# Patient Record
Sex: Female | Born: 1960 | Race: White | Hispanic: No | Marital: Married | Smoking: Never smoker
Health system: Southern US, Community
[De-identification: ages and names within clinical notes are randomized; demographics above are authoritative.]

## PROBLEM LIST (undated history)

## (undated) DIAGNOSIS — T8859XA Other complications of anesthesia, initial encounter: Secondary | ICD-10-CM

## (undated) DIAGNOSIS — T884XXA Failed or difficult intubation, initial encounter: Secondary | ICD-10-CM

## (undated) HISTORY — PX: TUBAL LIGATION: SHX77

## (undated) HISTORY — PX: NASAL SINUS SURGERY: SHX719

## (undated) HISTORY — PX: TONSILLECTOMY: SUR1361

---

## 2004-11-03 ENCOUNTER — Ambulatory Visit: Payer: Self-pay | Admitting: Unknown Physician Specialty

## 2004-11-09 ENCOUNTER — Ambulatory Visit: Payer: Self-pay | Admitting: Unknown Physician Specialty

## 2008-06-01 ENCOUNTER — Ambulatory Visit: Payer: Self-pay | Admitting: Internal Medicine

## 2009-03-19 ENCOUNTER — Ambulatory Visit: Payer: Self-pay | Admitting: Internal Medicine

## 2013-01-08 ENCOUNTER — Ambulatory Visit: Payer: Self-pay | Admitting: Otolaryngology

## 2013-03-25 DIAGNOSIS — T884XXA Failed or difficult intubation, initial encounter: Secondary | ICD-10-CM | POA: Insufficient documentation

## 2013-04-01 LAB — HM PAP SMEAR: HM PAP: NORMAL

## 2014-07-10 LAB — BASIC METABOLIC PANEL
BUN: 7 mg/dL (ref 4–21)
Creatinine: 0.8 mg/dL (ref 0.5–1.1)

## 2014-07-10 LAB — TSH: TSH: 1.3 u[IU]/mL (ref 0.41–5.90)

## 2014-07-10 LAB — LIPID PANEL
Cholesterol: 259 mg/dL — AB (ref 0–200)
HDL: 71 mg/dL — AB (ref 35–70)
LDL Cholesterol: 172 mg/dL
TRIGLYCERIDES: 81 mg/dL (ref 40–160)

## 2014-07-10 LAB — CBC AND DIFFERENTIAL: HEMOGLOBIN: 13.2 g/dL (ref 12.0–16.0)

## 2014-09-19 LAB — HM MAMMOGRAPHY: HM MAMMO: NORMAL

## 2014-10-02 HISTORY — PX: NASAL SINUS SURGERY: SHX719

## 2015-01-04 IMAGING — CT CT MAXILLOFACIAL WO/W CM
2 series · 15 of 30 positions shown, 19 images · non-contrast
Comparison: No comparison

REASON FOR EXAM: rt maxillary wall abscess infection
COMMENTS:

PROCEDURE:     KCT - KCT MAXILLOFACIAL AREA W/WO  - January 08, 2013 [DATE]
RESULT:     History: Maxillary wall abscess
TECHNIQUE: Multiple axial images obtained of the maxillofacial bones with
coronal reformatted images provided following 75 mL of [id number]

[Series 5: axial soft tissue · axial · 0.36mm/px · z∈[-228,-204]mm · 2 of 88 slices shown]
[im 7/88  brain]
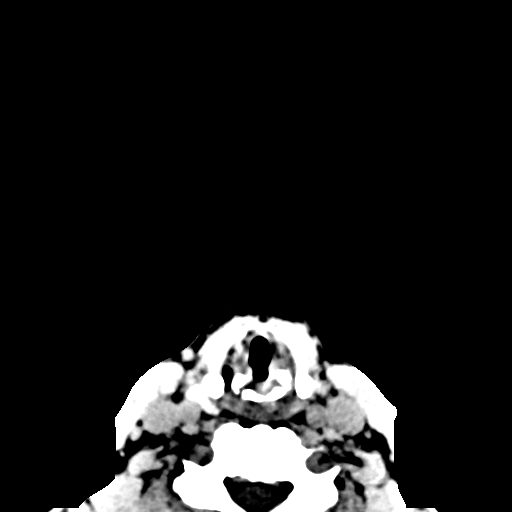
[im 19/88  brain]
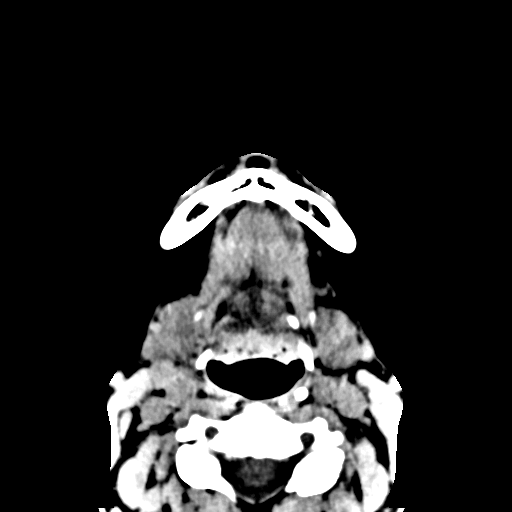

[Series 7: axial soft tissue with · axial · 0.41mm/px · z∈[-228,-68]mm · 13 of 94 slices shown, 17 images]
[im 7/94  brain]
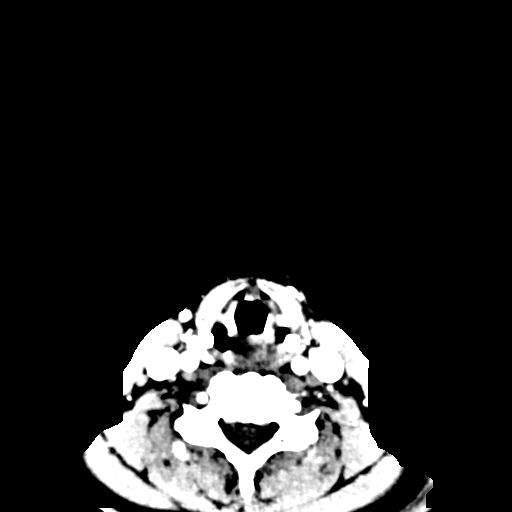
[im 7/94  bone]
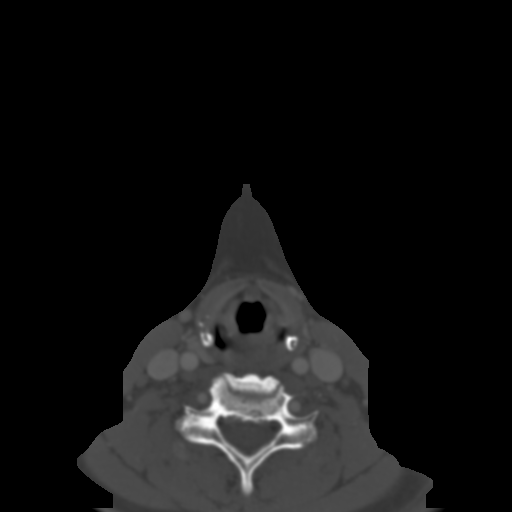
[im 14/94  bone]
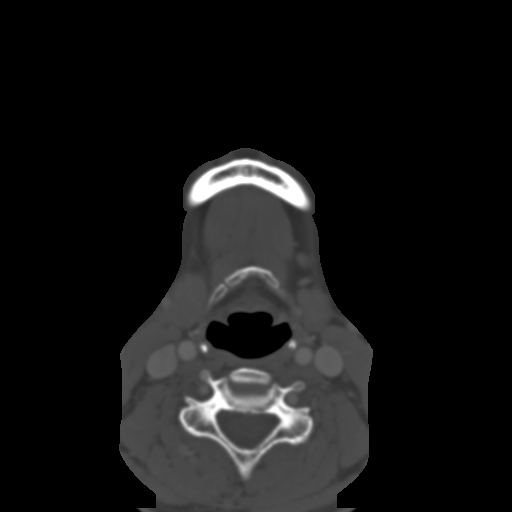
[im 20/94  bone]
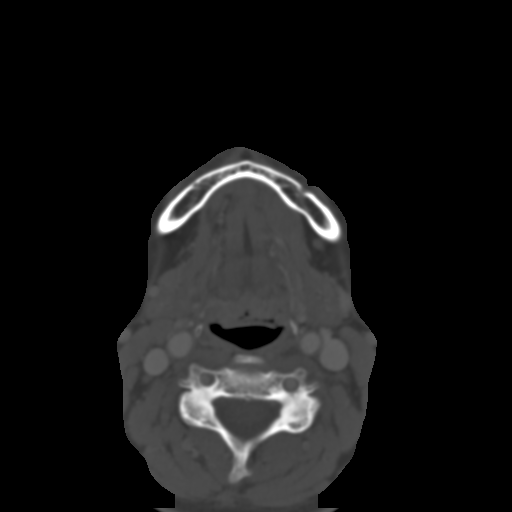
[im 27/94  bone]
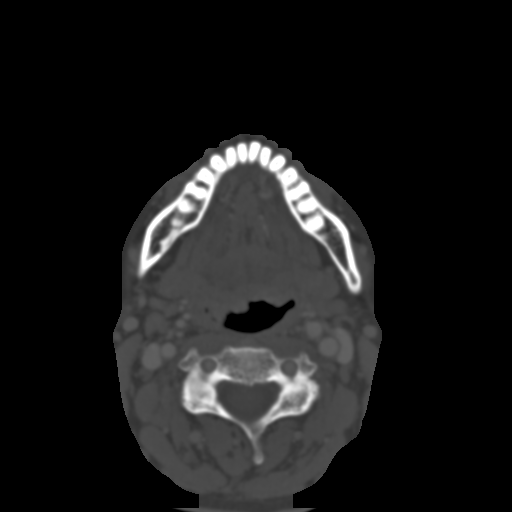
[im 34/94  brain]
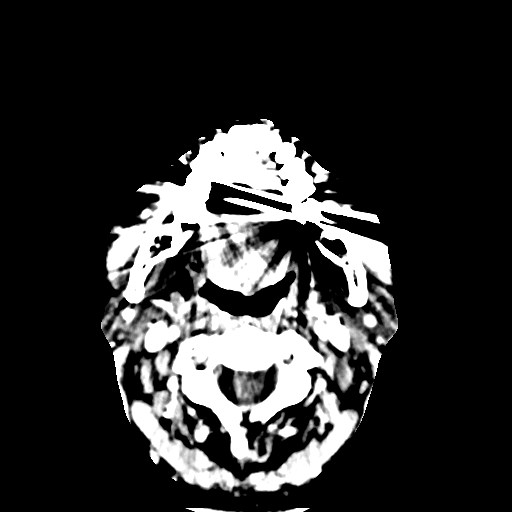
[im 34/94  bone]
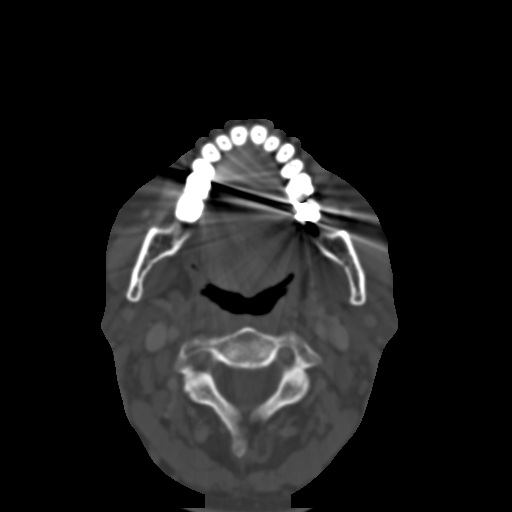
[im 40/94  bone]
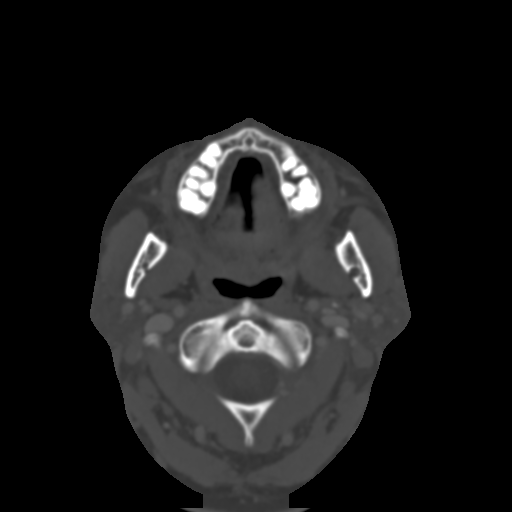
[im 47/94  bone]
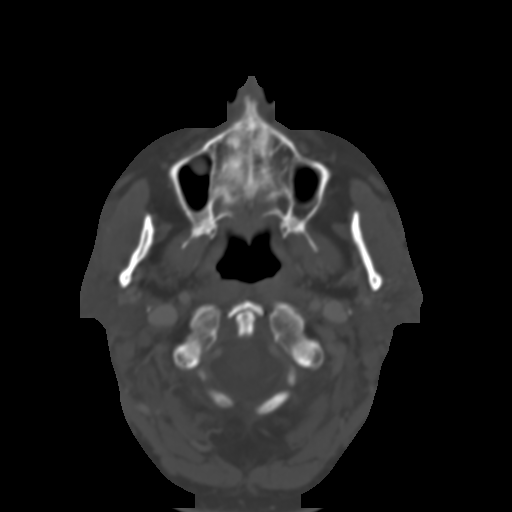
[im 54/94  bone]
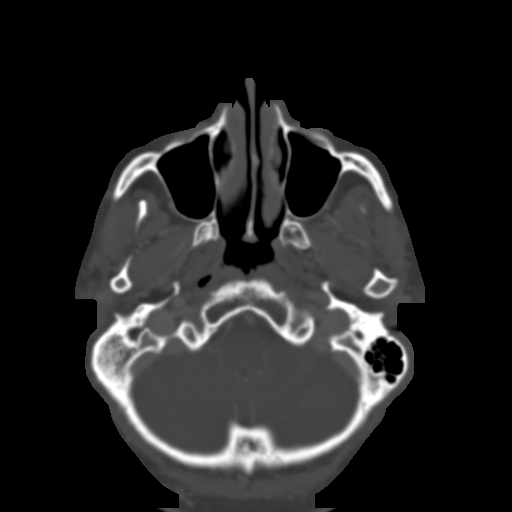
[im 60/94  brain]
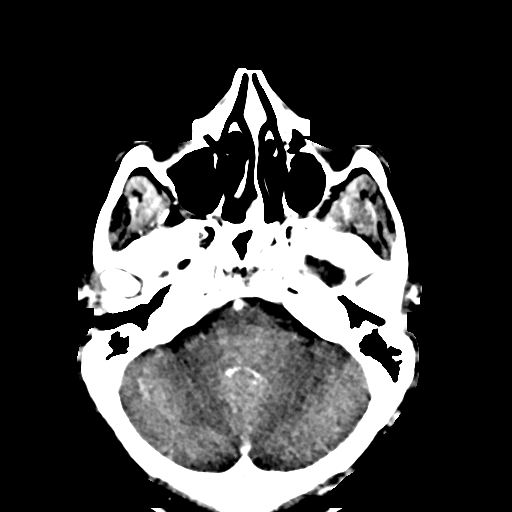
[im 60/94  bone]
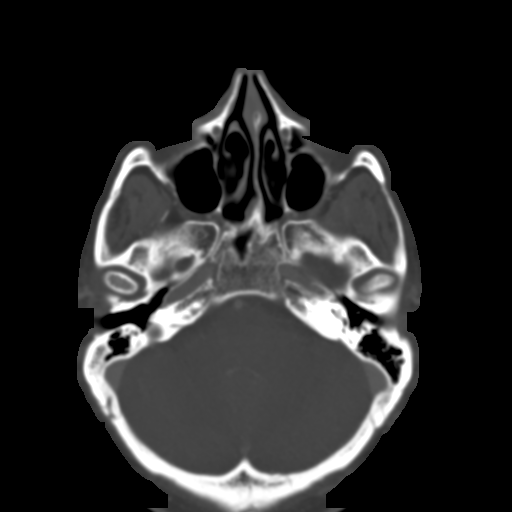
[im 67/94  bone]
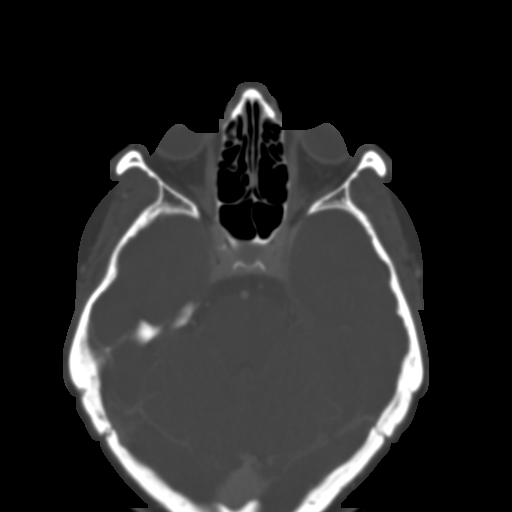
[im 74/94  bone]
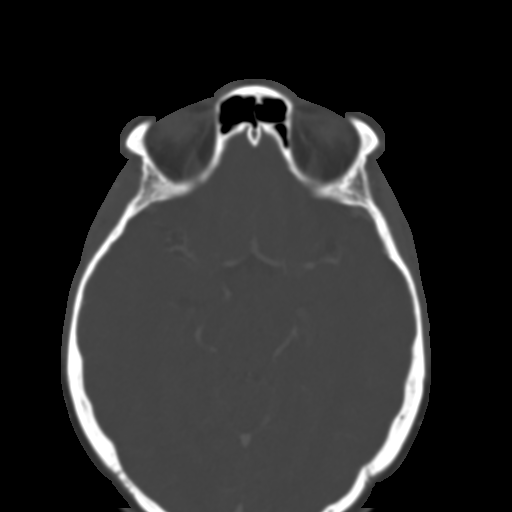
[im 80/94  bone]
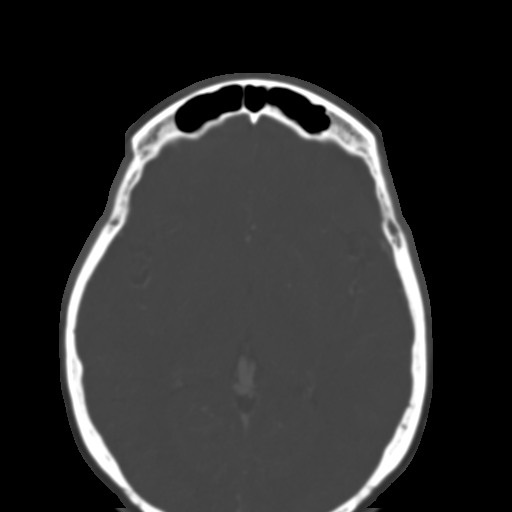
[im 87/94  brain]
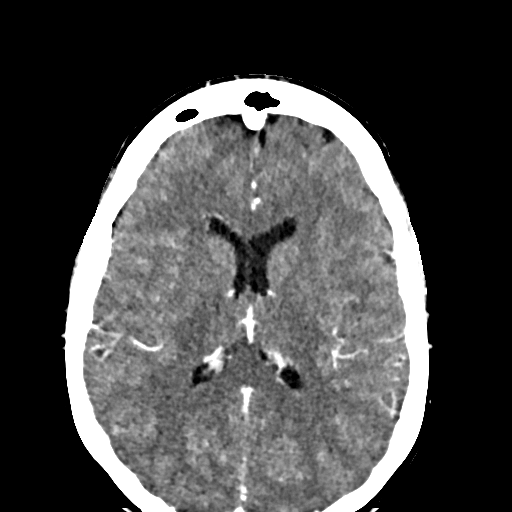
[im 87/94  bone]
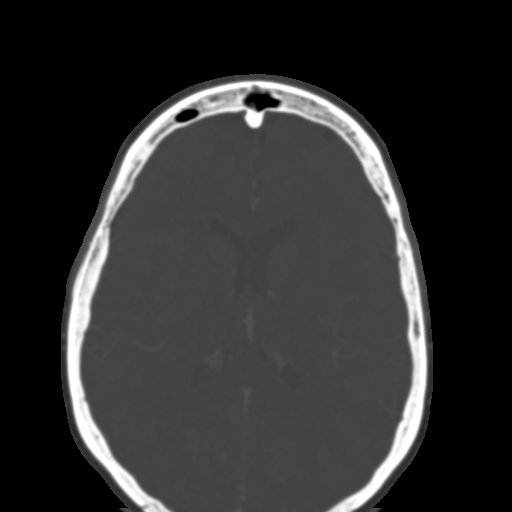

[15 of 30 positions shown; findings below may reference images not displayed]

FINDINGS: There are erosive changes at the root of the right maxillary premolar
eroding the buckle aspect of the right lateral wall of the maxillary sinus
with mild surrounding inflammatory change. There is no focal fluid
collections suggest an abscess. There is a right inferior maxillary sinus
mucocele present extending from the tip of the root of the tooth.

The globes are intact. The orbital walls are intact. The orbital floor is
intact. The mandible are intact. The zygomatic arches are intact. The nasal
septum is midline. There is no nasal bone fracture. The temporomandibular
joints are normal.

The remainder of the paranasal sinuses are clear. The visualized portions of
the mastoid sinuses are well aerated.
IMPRESSION: There are erosive changes at the root of the right maxillary premolar
eroding buckle aspect of the right lateral wall of the maxillary sinus with
mild surrounding inflammatory change. There is no focal fluid collections
suggest an abscess. There is a right inferior maxillary sinus mucocele
present extending from the tip of the root of the tooth.

[REDACTED]

## 2015-07-23 ENCOUNTER — Ambulatory Visit: Payer: Self-pay | Admitting: Physician Assistant

## 2015-07-23 ENCOUNTER — Encounter: Payer: Self-pay | Admitting: Physician Assistant

## 2015-07-23 VITALS — BP 142/76 | Temp 98.1°F

## 2015-07-23 DIAGNOSIS — J069 Acute upper respiratory infection, unspecified: Secondary | ICD-10-CM

## 2015-07-23 MED ORDER — FLUTICASONE PROPIONATE 50 MCG/ACT NA SUSP: 2.0000 | Freq: Every day | NASAL | Status: DC

## 2015-07-23 NOTE — Progress Notes (Signed)
S: C/o runny nose and congestion for 3 days, no fever, chills, cp/sob, v/d; had flu shot last week and sx started afterward  Using otc meds: sudafed  O: PE: vitals wnl, nad,  perrl eomi, normocephalic, tms dull, nasal mucosa pink and swollen, throat injected, neck supple no lymph, lungs c t a, cv rrr, neuro intact  A:  Acute uri, viral   P: flonase, sudafed, drink fluids, continue regular meds , use otc meds of choice, return if not improving in 5 days, return earlier if worsening

## 2015-09-11 ENCOUNTER — Encounter: Payer: Self-pay | Admitting: Internal Medicine

## 2015-09-11 ENCOUNTER — Other Ambulatory Visit: Payer: Self-pay | Admitting: Internal Medicine

## 2015-09-11 DIAGNOSIS — E785 Hyperlipidemia, unspecified: Secondary | ICD-10-CM | POA: Insufficient documentation

## 2015-09-11 DIAGNOSIS — N6019 Diffuse cystic mastopathy of unspecified breast: Secondary | ICD-10-CM | POA: Insufficient documentation

## 2016-04-10 ENCOUNTER — Encounter: Payer: Self-pay | Admitting: Physician Assistant

## 2016-04-10 ENCOUNTER — Ambulatory Visit: Payer: Self-pay | Admitting: Registered Nurse

## 2016-04-10 VITALS — BP 110/80 | HR 80 | Temp 98.1°F

## 2016-04-10 DIAGNOSIS — J302 Other seasonal allergic rhinitis: Secondary | ICD-10-CM

## 2016-04-10 DIAGNOSIS — H8113 Benign paroxysmal vertigo, bilateral: Secondary | ICD-10-CM

## 2016-04-10 MED ORDER — MECLIZINE HCL 25 MG PO TABS: 25.0000 mg | ORAL_TABLET | Freq: Three times a day (TID) | ORAL | Status: DC | PRN

## 2016-04-10 MED ORDER — MECLIZINE HCL 32 MG PO TABS: 32.0000 mg | ORAL_TABLET | Freq: Three times a day (TID) | ORAL | Status: DC | PRN

## 2016-04-10 MED ORDER — SALINE SPRAY 0.65 % NA SOLN: 1.0000 | NASAL | Status: DC | PRN

## 2016-04-10 MED ORDER — FLUTICASONE PROPIONATE 50 MCG/ACT NA SUSP: 2.0000 | Freq: Every day | NASAL | Status: DC

## 2016-04-10 NOTE — Progress Notes (Signed)
Subjective:    Patient ID: Cindy Morgan, female    DOB: 02-14-1961, 55 y.o.   MRN: [id number]  HPI Comments: Married caucasian female with intermittent seasonal allergies typically uses flonase and sudafed.  Started sudafed Saturday 8 Jul has noticed improvement post nasal drip less ear pain needs refill flonase.  Room spinning with sudden head movements less frequent occurences since starting sudafed  Dizziness This is a new problem. The current episode started in the past 7 days. The problem occurs daily. The problem has been gradually improving. Associated symptoms include congestion and vertigo. Pertinent negatives include no abdominal pain, anorexia, arthralgias, change in bowel habit, chest pain, chills, coughing, diaphoresis, fatigue, fever, headaches, joint swelling, myalgias, nausea, neck pain, numbness, rash, sore throat, swollen glands, urinary symptoms, visual change, vomiting or weakness. The symptoms are aggravated by bending. She has tried eating and rest (sudafed) for the symptoms. The treatment provided moderate relief.      Review of Systems  Constitutional: Negative for fever, chills, diaphoresis, activity change, appetite change, fatigue and unexpected weight change.  HENT: Positive for congestion, ear pain and postnasal drip. Negative for dental problem, drooling, ear discharge, facial swelling, hearing loss, mouth sores, nosebleeds, rhinorrhea, sinus pressure, sneezing, sore throat, tinnitus, trouble swallowing and voice change.   Eyes: Negative for photophobia, pain, discharge, redness, itching and visual disturbance.  Respiratory: Negative for cough, choking, chest tightness, shortness of breath, wheezing and stridor.   Cardiovascular: Negative for chest pain, palpitations and leg swelling.  Gastrointestinal: Negative for nausea, vomiting, abdominal pain, diarrhea, constipation, blood in stool, abdominal distention, anorexia and change in bowel habit.  Endocrine: Negative  for cold intolerance and heat intolerance.  Genitourinary: Negative for dysuria, hematuria and difficulty urinating.  Musculoskeletal: Negative for myalgias, back pain, joint swelling, arthralgias, gait problem, neck pain and neck stiffness.  Skin: Negative for color change, pallor, rash and wound.  Allergic/Immunologic: Positive for environmental allergies. Negative for food allergies.  Neurological: Positive for dizziness and vertigo. Negative for tremors, seizures, syncope, facial asymmetry, speech difficulty, weakness, light-headedness, numbness and headaches.  Hematological: Negative for adenopathy. Does not bruise/bleed easily.  Psychiatric/Behavioral: Negative for behavioral problems, confusion, sleep disturbance and agitation.       Objective:   Physical Exam  Constitutional: She is oriented to person, place, and time. Vital signs are normal. She appears well-developed and well-nourished. She is active and cooperative.  Non-toxic appearance. She does not have a sickly appearance. She appears ill. No distress.  HENT:  Head: Normocephalic and atraumatic.  Right Ear: Hearing, external ear and ear canal normal. A middle ear effusion is present.  Left Ear: Hearing, external ear and ear canal normal. A middle ear effusion is present.  Nose: Mucosal edema and rhinorrhea present. No nose lacerations, sinus tenderness, nasal deformity, septal deviation or nasal septal hematoma. No epistaxis.  No foreign bodies. Right sinus exhibits no maxillary sinus tenderness and no frontal sinus tenderness. Left sinus exhibits no maxillary sinus tenderness and no frontal sinus tenderness.  Mouth/Throat: Uvula is midline and mucous membranes are normal. Mucous membranes are not pale, not dry and not cyanotic. She does not have dentures. No oral lesions. No trismus in the jaw. Normal dentition. No dental abscesses, uvula swelling, lacerations or dental caries. Posterior oropharyngeal edema and posterior  oropharyngeal erythema present. No oropharyngeal exudate or tonsillar abscesses.  Cobblestoning posterior pharynx; slight opacity bilteral TMs air fluid level, bilateral allergic shiners  Eyes: Conjunctivae, EOM and lids are normal. Pupils are equal, round,  and reactive to light. Right eye exhibits no chemosis, no discharge, no exudate and no hordeolum. No foreign body present in the right eye. Left eye exhibits no chemosis, no discharge, no exudate and no hordeolum. No foreign body present in the left eye. Right conjunctiva is not injected. Right conjunctiva has no hemorrhage. Left conjunctiva is not injected. Left conjunctiva has no hemorrhage. No scleral icterus. Right eye exhibits normal extraocular motion and no nystagmus. Left eye exhibits normal extraocular motion and no nystagmus. Right pupil is round and reactive. Left pupil is round and reactive. Pupils are equal.  Neck: Trachea normal and normal range of motion. Neck supple. No tracheal tenderness, no spinous process tenderness and no muscular tenderness present. No rigidity. No tracheal deviation, no edema, no erythema and normal range of motion present. No thyroid mass and no thyromegaly present.  Cardiovascular: Normal rate, regular rhythm, S1 normal, S2 normal, normal heart sounds and intact distal pulses.  PMI is not displaced.  Exam reveals no gallop and no friction rub.   No murmur heard. Pulmonary/Chest: Effort normal and breath sounds normal. No accessory muscle usage or stridor. No respiratory distress. She has no decreased breath sounds. She has no wheezes. She has no rhonchi. She has no rales. She exhibits no tenderness.  Abdominal: Soft. She exhibits no distension.  Musculoskeletal: Normal range of motion. She exhibits no edema or tenderness.       Right shoulder: Normal.       Left shoulder: Normal.       Right hip: Normal.       Left hip: Normal.       Right knee: Normal.       Left knee: Normal.       Cervical back: Normal.        Right hand: Normal.       Left hand: Normal.  Lymphadenopathy:       Head (right side): No submental, no submandibular, no tonsillar, no preauricular, no posterior auricular and no occipital adenopathy present.       Head (left side): No submental, no submandibular, no tonsillar, no preauricular, no posterior auricular and no occipital adenopathy present.    She has no cervical adenopathy.       Right cervical: No superficial cervical, no deep cervical and no posterior cervical adenopathy present.      Left cervical: No superficial cervical, no deep cervical and no posterior cervical adenopathy present.  Neurological: She is alert and oriented to person, place, and time. She has normal strength. She is not disoriented. She displays no atrophy and no tremor. No cranial nerve deficit or sensory deficit. She exhibits normal muscle tone. She displays no seizure activity. Coordination and gait normal. GCS eye subscore is 4. GCS verbal subscore is 5. GCS motor subscore is 6.  On off exam table without difficulty  Skin: Skin is warm, dry and intact. No abrasion, no bruising, no burn, no ecchymosis, no laceration, no lesion, no petechiae and no rash noted. She is not diaphoretic. No cyanosis or erythema. No pallor. Nails show no clubbing.  Psychiatric: She has a normal mood and affect. Her speech is normal and behavior is normal. Judgment and thought content normal. Cognition and memory are normal.  Nursing note and vitals reviewed.         Assessment & Plan:  A- seasonal allergic rhinitis, bilateral otitis media with effusion, vertigo  Discussed with patient otitis media with effusion probably causing vertigo but could also be age.  Supportive treatment may take up to 4 doses meclizine per day max  per 24 hours.  Discussed signs/symptoms stroke.    Follow up if aphasia, dysphasia, visual changes, weakness, fall, worst headache of life, incoordination, fever, ear discharge.  Consider ENT  evaluation/follow up with PCM if worsening symptoms not controlled with meclizine or needs Rx refill.  Patient verbalized understanding of information/agreed with plan of care and had no further questions at this time.  Patient may use normal saline nasal spray as needed.  Consider antihistamine or nasal steroid use has used zyrtec and flonase in the past.  Caution antihistamines and sudafed may cause drowsiness.  Avoid triggers if possible.  Shower prior to bedtime if exposed to triggers.  If allergic dust/dust mites recommend mattress/pillow covers/encasements; washing linens, vacuuming, sweeping, dusting weekly.  Call or return to clinic as needed if these symptoms worsen or fail to improve as anticipated.  Patient verbalized understanding of instructions, agreed with plan of care and had no further questions at this time.  P2:  Avoidance and hand washing.  Supportive treatment.   No evidence of invasive bacterial infection, non toxic and well hydrated.  This is most likely self limiting viral infection.  I do not see where any further testing or imaging is necessary at this time.   I will suggest supportive care, rest, good hygiene and encourage the patient to take adequate fluids.  The patient is to return to clinic or EMERGENCY ROOM if symptoms worsen or change significantly e.g. ear pain, fever, purulent discharge from ears or bleeding.   Patient verbalized agreement and understanding of treatment plan.

## 2016-04-10 NOTE — Addendum Note (Signed)
Addended by: Albina BilletBETANCOURT, TINA A on: 04/10/2016 02:13 PM   Modules accepted: Orders, Medications

## 2016-09-22 LAB — HM MAMMOGRAPHY

## 2017-03-02 ENCOUNTER — Ambulatory Visit: Payer: Self-pay | Admitting: Physician Assistant

## 2017-03-02 ENCOUNTER — Encounter: Payer: Self-pay | Admitting: Physician Assistant

## 2017-03-02 VITALS — BP 122/90 | HR 82 | Temp 98.7°F

## 2017-03-02 DIAGNOSIS — R319 Hematuria, unspecified: Secondary | ICD-10-CM

## 2017-03-02 DIAGNOSIS — N939 Abnormal uterine and vaginal bleeding, unspecified: Secondary | ICD-10-CM

## 2017-03-02 NOTE — Progress Notes (Signed)
S: pt states she had mid back pain earlier in the week, now her lower back hurts, noticed some spotting in her underwear and noticed she passed a blood clot ?during urination, noticed blood when she wiped later, has not had a period in 10 years, no hx kidney stones, no flank pain, no fever/chills  O: vitals wnl, nad, no cva tenderness, lower back is mildly tender, abd soft a little tender in left flank area, bs normal, ua 1+ blood  A: hematuria, ?vaginal bleeding  P: will recheck urine next week, recommend she make an appointment with gyn for eval due to ?vaginal bleeding

## 2017-03-15 ENCOUNTER — Ambulatory Visit (INDEPENDENT_AMBULATORY_CARE_PROVIDER_SITE_OTHER): Payer: 59 | Admitting: Obstetrics and Gynecology

## 2017-03-15 ENCOUNTER — Encounter: Payer: Self-pay | Admitting: Obstetrics and Gynecology

## 2017-03-15 VITALS — BP 149/79 | HR 75 | Ht 66.5 in | Wt 186.1 lb

## 2017-03-15 DIAGNOSIS — R3129 Other microscopic hematuria: Secondary | ICD-10-CM | POA: Diagnosis not present

## 2017-03-15 DIAGNOSIS — N939 Abnormal uterine and vaginal bleeding, unspecified: Secondary | ICD-10-CM | POA: Diagnosis not present

## 2017-03-15 LAB — POCT URINALYSIS DIPSTICK
Bilirubin, UA: NEGATIVE
Glucose, UA: NEGATIVE
KETONES UA: NEGATIVE
Leukocytes, UA: NEGATIVE
Nitrite, UA: NEGATIVE
PROTEIN UA: NEGATIVE
SPEC GRAV UA: 1.01 (ref 1.010–1.025)
Urobilinogen, UA: 0.2 E.U./dL
pH, UA: 6 (ref 5.0–8.0)

## 2017-03-15 NOTE — Progress Notes (Signed)
Subjective:     Patient ID: Cindy BullockAmy S Morgan, female   DOB: 12-Oct-1960, 56 y.o.   MRN: [id number]  HPI Noted blood in underwear and in urine 2 weeks ago. Seen at employee clinic and urine was positive for blood. Lower left pelvic tenderness. Denies bleeding after sex or urination.   Thinks menopause was about 10 years. Did have a cyst years ago.  Review of Systems Negative except stated above in HPI    Objective:   Physical Exam A&Ox4 Well groomed female in no distress Blood pressure (!) 149/79, pulse 75, height 5' 6.5" (1.689 m), weight 186 lb 1.6 oz (84.4 kg). Urinalysis    Component Value Date/Time   BILIRUBINUR neg 03/15/2017 1355   PROTEINUR neg 03/15/2017 1355   UROBILINOGEN 0.2 03/15/2017 1355   NITRITE neg 03/15/2017 1355   LEUKOCYTESUR Negative 03/15/2017 1355   Trace blood in UA Pelvic exam: normal external genitalia, vulva, vagina, cervix, uterus and adnexa.    Assessment:     Hematuria, unknown etiology LLQ pain with h/o ovarian cyst    Plan:     Pelvic and renal ultrasound ordered, urine sent for culture. Will follow up accordingly.  Melody LatimerShambley, CNM

## 2017-03-16 ENCOUNTER — Telehealth: Payer: Self-pay | Admitting: Obstetrics and Gynecology

## 2017-03-16 ENCOUNTER — Other Ambulatory Visit: Payer: Self-pay | Admitting: *Deleted

## 2017-03-16 DIAGNOSIS — N939 Abnormal uterine and vaginal bleeding, unspecified: Secondary | ICD-10-CM

## 2017-03-16 NOTE — Telephone Encounter (Signed)
Hey order is in if you would call her and set up please, thanks

## 2017-03-16 NOTE — Telephone Encounter (Signed)
Patient called to schedule US and was told that they need an US transvaginal order also to be able to schedule  Please call patient when this is entered and she will call to schedule the [place]

## 2017-03-17 LAB — URINE CULTURE

## 2017-03-20 NOTE — Telephone Encounter (Signed)
Contacted patient that order is now in and she will call to schedule. Patient will call me if she has any further issues with scheduling the appointment.

## 2017-03-21 ENCOUNTER — Encounter: Payer: Self-pay | Admitting: Obstetrics and Gynecology

## 2017-03-22 ENCOUNTER — Other Ambulatory Visit: Payer: Self-pay | Admitting: Obstetrics and Gynecology

## 2017-03-26 ENCOUNTER — Ambulatory Visit
Admission: RE | Admit: 2017-03-26 | Discharge: 2017-03-26 | Disposition: A | Discharge status: Home / Self Care | Payer: 59 | Source: Ambulatory Visit | Attending: Obstetrics and Gynecology | Admitting: Obstetrics and Gynecology

## 2017-03-26 DIAGNOSIS — R3129 Other microscopic hematuria: Secondary | ICD-10-CM

## 2017-03-26 DIAGNOSIS — N393 Stress incontinence (female) (male): Secondary | ICD-10-CM | POA: Insufficient documentation

## 2017-03-26 DIAGNOSIS — N939 Abnormal uterine and vaginal bleeding, unspecified: Secondary | ICD-10-CM | POA: Diagnosis present

## 2017-03-26 DIAGNOSIS — R109 Unspecified abdominal pain: Secondary | ICD-10-CM | POA: Diagnosis not present

## 2017-03-26 DIAGNOSIS — R319 Hematuria, unspecified: Secondary | ICD-10-CM | POA: Diagnosis not present

## 2017-06-20 ENCOUNTER — Encounter: Payer: Self-pay | Admitting: Internal Medicine

## 2017-06-20 ENCOUNTER — Ambulatory Visit (INDEPENDENT_AMBULATORY_CARE_PROVIDER_SITE_OTHER): Payer: 59 | Admitting: Internal Medicine

## 2017-06-20 VITALS — BP 122/68 | HR 85 | Ht 66.5 in | Wt 192.7 lb

## 2017-06-20 DIAGNOSIS — R3129 Other microscopic hematuria: Secondary | ICD-10-CM | POA: Diagnosis not present

## 2017-06-20 DIAGNOSIS — Z0000 Encounter for general adult medical examination without abnormal findings: Secondary | ICD-10-CM

## 2017-06-20 DIAGNOSIS — E782 Mixed hyperlipidemia: Secondary | ICD-10-CM

## 2017-06-20 DIAGNOSIS — Z124 Encounter for screening for malignant neoplasm of cervix: Secondary | ICD-10-CM | POA: Diagnosis not present

## 2017-06-20 DIAGNOSIS — Z1211 Encounter for screening for malignant neoplasm of colon: Secondary | ICD-10-CM

## 2017-06-20 DIAGNOSIS — Z1239 Encounter for other screening for malignant neoplasm of breast: Secondary | ICD-10-CM

## 2017-06-20 LAB — POC URINALYSIS WITH MICROSCOPIC (NON AUTO)MANUAL RESULT
Bacteria, UA: 0
Bilirubin, UA: NEGATIVE
Crystals: 0
Epithelial cells, urine per micros: 1
GLUCOSE UA: NEGATIVE
KETONES UA: NEGATIVE
Mucus, UA: 0
NITRITE UA: NEGATIVE
Protein, UA: NEGATIVE
RBC: 0 M/uL — AB (ref 4.04–5.48)
Urobilinogen, UA: 0.2 E.U./dL
WBC Casts, UA: 1
pH, UA: 7.5 (ref 5.0–8.0)

## 2017-06-20 NOTE — Progress Notes (Signed)
Date:  06/20/2017   Name:  Cindy Morgan   DOB:  05-16-1961   MRN:  [id number]   Chief Complaint: Annual Exam (Breast Exam and Papsmear) Cindy Morgan is a 56 y.o. female who presents today for her Complete Annual Exam. She feels well. She reports exercising not regularly. She reports she is sleeping well.  She has mammograms at [facility] every December.  She is not sure when her last Pap was done.  She has not had a colonoscopy.  Hematuria - patient saw scant blood in her underclothes back in June. She went to employee health urine dip positive for blood. She was then seen by GYN due to the pelvic examination showed no source of bleeding. Her urine was again positive for blood but no microscopic was done at that time. Urine culture was sent and was negative. She then had a renal ultrasound which was entirely normal. No further bleeding and has no symptoms. She does have mild occasional left lower quadrant discomfort that does not persist.   Review of Systems  Constitutional: Negative for chills, fatigue and fever.  HENT: Negative for congestion, hearing loss, tinnitus, trouble swallowing and voice change.   Eyes: Negative for visual disturbance.  Respiratory: Negative for cough, chest tightness, shortness of breath and wheezing.   Cardiovascular: Negative for chest pain, palpitations and leg swelling.  Gastrointestinal: Negative for abdominal pain, constipation, diarrhea and vomiting.  Endocrine: Negative for polydipsia and polyuria.  Genitourinary: Negative for dysuria, frequency, genital sores, pelvic pain, vaginal bleeding and vaginal discharge.  Musculoskeletal: Negative for arthralgias, gait problem and joint swelling.  Skin: Negative for color change and rash.  Allergic/Immunologic: Negative for environmental allergies and food allergies.  Neurological: Negative for dizziness, tremors, light-headedness and headaches.  Hematological: Negative for adenopathy. Does not bruise/bleed easily.    Psychiatric/Behavioral: Negative for dysphoric mood and sleep disturbance. The patient is not nervous/anxious.     Patient Active Problem List   Diagnosis Date Noted  . 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 HLD (hyperlipidemia) 09/11/2015  . Failed or difficult intubation, initial encounter 03/25/2013    Prior to Admission medications   Medication Sig Start Date End Date Taking? Authorizing Provider  fluticasone (FLONASE) 50 MCG/ACT nasal spray Place 2 sprays into both nostrils daily. 07/23/15  Yes Faythe Ghee, PA-C  Multiple Vitamin (MULTI-VITAMINS) TABS Take by mouth.   Yes [provider]  sodium chloride (OCEAN) 0.65 % SOLN nasal spray Place 1 spray into both nostrils as needed for congestion. 04/10/16  Yes Betancourt, Jarold Song, NP    No Known Allergies  Past Surgical History:  Procedure Laterality Date  . NASAL SINUS SURGERY    . TONSILLECTOMY    . TUBAL LIGATION      Social History  Substance Use Topics  . Smoking status: Never Smoker  . Smokeless tobacco: Never Used  . Alcohol use No     Medication list has been reviewed and updated.  PHQ 2/9 Scores 06/20/2017  PHQ - 2 Score 0    Physical Exam  Constitutional: She is oriented to person, place, and time. She appears well-developed and well-nourished. No distress.  HENT:  Head: Normocephalic and atraumatic.  Right Ear: Tympanic membrane and ear canal normal.  Left Ear: Tympanic membrane and ear canal normal.  Nose: Right sinus exhibits no maxillary sinus tenderness. Left sinus exhibits no maxillary sinus tenderness.  Mouth/Throat: Uvula is midline and oropharynx is clear and moist.  Eyes: Conjunctivae and EOM are normal.  Right eye exhibits no discharge. Left eye exhibits no discharge. No scleral icterus.  Neck: Normal range of motion. Carotid bruit is not present. No erythema present. No thyromegaly present.  Cardiovascular: Normal rate, regular rhythm, normal heart sounds and normal pulses.    Pulmonary/Chest: Effort normal. No respiratory distress. She has no wheezes. Right breast exhibits no mass, no nipple discharge, no skin change and no tenderness. Left breast exhibits no mass, no nipple discharge, no skin change and no tenderness.  Abdominal: Soft. Bowel sounds are normal. There is no hepatosplenomegaly. There is no tenderness. There is no CVA tenderness.  Genitourinary: Rectum normal, vagina normal and uterus normal. There is no tenderness, lesion or injury on the right labia. There is no tenderness, lesion or injury on the left labia. Cervix exhibits no motion tenderness, no discharge and no friability. Right adnexum displays no mass, no tenderness and no fullness. Left adnexum displays no mass, no tenderness and no fullness.  Musculoskeletal: Normal range of motion.  Lymphadenopathy:    She has no cervical adenopathy.    She has no axillary adenopathy.  Neurological: She is alert and oriented to person, place, and time. She has normal reflexes. No cranial nerve deficit or sensory deficit.  Skin: Skin is warm, dry and intact. No rash noted.     Psychiatric: She has a normal mood and affect. Her speech is normal and behavior is normal. Thought content normal.  Nursing note and vitals reviewed.   BP 122/68   Pulse 85   Ht 5' 6.5" (1.689 m)   Wt 192 lb 11.2 oz (87.4 kg)   SpO2 100%   BMI 30.64 kg/m   Assessment and Plan: 1. Annual physical exam Normal exam - CBC with Differential/Platelet - Comprehensive metabolic panel - TSH  2. Breast cancer screening Patient to schedule at [facility] in December  3. Mixed hyperlipidemia Advise on medication when labs return - Lipid panel  4. Colon cancer screening Check on cologuard coverage - Cologuard  5. Encounter for Papanicolaou smear for cervical cancer screening - Pap IG and HPV (high risk) DNA detection  6. Microscopic hematuria No RBC seen on microscopic exam If pt desires, or if blooding recurs, will refer to  Urology - POC urinalysis w microscopic (non auto)   No orders of the defined types were placed in this encounter.   Partially dictated using Animal nutritionist. Any errors are unintentional.  Bari Edward, MD [facility]  06/20/2017

## 2017-06-22 LAB — PLEASE NOTE

## 2017-06-22 LAB — PAP IG AND HPV HIGH-RISK
HPV, HIGH-RISK: NEGATIVE
PAP Smear Comment: 0

## 2017-06-27 DIAGNOSIS — Z0000 Encounter for general adult medical examination without abnormal findings: Secondary | ICD-10-CM | POA: Diagnosis not present

## 2017-06-27 DIAGNOSIS — E782 Mixed hyperlipidemia: Secondary | ICD-10-CM | POA: Diagnosis not present

## 2017-06-28 LAB — COMPREHENSIVE METABOLIC PANEL
ALK PHOS: 68 IU/L (ref 39–117)
ALT: 18 IU/L (ref 0–32)
AST: 21 IU/L (ref 0–40)
Albumin/Globulin Ratio: 1.8 (ref 1.2–2.2)
Albumin: 4.4 g/dL (ref 3.5–5.5)
BUN / CREAT RATIO: 12 (ref 9–23)
BUN: 9 mg/dL (ref 6–24)
Bilirubin Total: 1 mg/dL (ref 0.0–1.2)
CHLORIDE: 102 mmol/L (ref 96–106)
CO2: 25 mmol/L (ref 20–29)
Calcium: 9.2 mg/dL (ref 8.7–10.2)
Creatinine, Ser: 0.77 mg/dL (ref 0.57–1.00)
GFR, EST AFRICAN AMERICAN: 101 mL/min/{1.73_m2} (ref >59)
GFR, EST NON AFRICAN AMERICAN: 87 mL/min/{1.73_m2} (ref >59)
Globulin, Total: 2.5 g/dL (ref 1.5–4.5)
Glucose: 84 mg/dL (ref 65–99)
POTASSIUM: 4.7 mmol/L (ref 3.5–5.2)
SODIUM: 141 mmol/L (ref 134–144)
Total Protein: 6.9 g/dL (ref 6.0–8.5)

## 2017-06-28 LAB — TSH: TSH: 2 u[IU]/mL (ref 0.450–4.500)

## 2017-06-28 LAB — LIPID PANEL
Chol/HDL Ratio: 3.5 ratio (ref 0.0–4.4)
Cholesterol, Total: 248 mg/dL — ABNORMAL HIGH (ref 100–199)
HDL: 70 mg/dL (ref >39)
LDL Calculated: 163 mg/dL — ABNORMAL HIGH (ref 0–99)
Triglycerides: 76 mg/dL (ref 0–149)
VLDL Cholesterol Cal: 15 mg/dL (ref 5–40)

## 2017-06-28 LAB — CBC WITH DIFFERENTIAL/PLATELET
Basophils Absolute: 0.1 10*3/uL (ref 0.0–0.2)
Basos: 1 %
EOS (ABSOLUTE): 0.2 10*3/uL (ref 0.0–0.4)
Eos: 4 %
Hematocrit: 38.9 % (ref 34.0–46.6)
Hemoglobin: 12.6 g/dL (ref 11.1–15.9)
Immature Grans (Abs): 0 10*3/uL (ref 0.0–0.1)
Immature Granulocytes: 0 %
Lymphocytes Absolute: 1.8 10*3/uL (ref 0.7–3.1)
Lymphs: 37 %
MCH: 29.9 pg (ref 26.6–33.0)
MCHC: 32.4 g/dL (ref 31.5–35.7)
MCV: 92 fL (ref 79–97)
Monocytes Absolute: 0.4 10*3/uL (ref 0.1–0.9)
Monocytes: 8 %
Neutrophils Absolute: 2.4 10*3/uL (ref 1.4–7.0)
Neutrophils: 50 %
Platelets: 279 10*3/uL (ref 150–379)
RBC: 4.22 x10E6/uL (ref 3.77–5.28)
RDW: 13.4 % (ref 12.3–15.4)
WBC: 4.7 10*3/uL (ref 3.4–10.8)

## 2018-08-17 DIAGNOSIS — Z1231 Encounter for screening mammogram for malignant neoplasm of breast: Secondary | ICD-10-CM | POA: Diagnosis not present

## 2018-11-13 ENCOUNTER — Ambulatory Visit (INDEPENDENT_AMBULATORY_CARE_PROVIDER_SITE_OTHER): Payer: 59 | Admitting: Internal Medicine

## 2018-11-13 ENCOUNTER — Other Ambulatory Visit: Payer: Self-pay

## 2018-11-13 ENCOUNTER — Encounter: Payer: Self-pay | Admitting: Internal Medicine

## 2018-11-13 VITALS — BP 128/78 | HR 95 | Temp 97.9°F | Ht 66.5 in | Wt 196.2 lb

## 2018-11-13 DIAGNOSIS — Z1211 Encounter for screening for malignant neoplasm of colon: Secondary | ICD-10-CM | POA: Diagnosis not present

## 2018-11-13 DIAGNOSIS — R310 Gross hematuria: Secondary | ICD-10-CM

## 2018-11-13 LAB — POCT URINALYSIS DIPSTICK
Bilirubin, UA: NEGATIVE
Glucose, UA: NEGATIVE
Ketones, UA: NEGATIVE
LEUKOCYTES UA: NEGATIVE
NITRITE UA: NEGATIVE
PH UA: 6 (ref 5.0–8.0)
PROTEIN UA: NEGATIVE
SPEC GRAV UA: 1.01 (ref 1.010–1.025)
Urobilinogen, UA: 0.2 E.U./dL

## 2018-11-13 NOTE — Progress Notes (Signed)
Date:  11/13/2018   Name:  Cindy Morgan   DOB:  10/05/1960   MRN:  [id number]   Chief Complaint: Hematuria (chronic gross hematuria, pt requesting a urology appt. She had microscopic hematuria for years. )  Hematuria  This is a recurrent problem. The problem has been rapidly worsening since onset. She describes the hematuria as gross hematuria. The hematuria occurs during the terminal portion of her urinary stream. She reports clotting at the end of her urine stream. She is experiencing no pain. She describes her urine color as yellow. Irritative symptoms do not include frequency or urgency. Associated symptoms include dysuria and flank pain. Pertinent negatives include no chills or fever. There is no history of kidney stones.    Review of Systems  Constitutional: Negative for chills and fever.  Respiratory: Negative for cough, chest tightness and shortness of breath.   Cardiovascular: Negative for chest pain and palpitations.  Genitourinary: Positive for dysuria, flank pain and hematuria. Negative for frequency and urgency.  Neurological: Negative for dizziness and headaches.    Patient Active Problem List   Diagnosis Date Noted  . Microscopic hematuria 06/20/2017  . 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 HLD (hyperlipidemia) 09/11/2015  . Failed or difficult intubation, initial encounter 03/25/2013    No Known Allergies  Past Surgical History:  Procedure Laterality Date  . NASAL SINUS SURGERY    . TONSILLECTOMY    . TUBAL LIGATION      Social History   Tobacco Use  . Smoking status: Never Smoker  . Smokeless tobacco: Never Used  Substance Use Topics  . Alcohol use: No    Alcohol/week: 0.0 standard drinks  . Drug use: No     Medication list has been reviewed and updated.  Current Meds  Medication Sig  . Multiple Vitamin (MULTI-VITAMINS) TABS Take by mouth.    PHQ 2/9 Scores 06/20/2017  PHQ - 2 Score 0    Physical Exam Vitals signs and nursing note reviewed.    Constitutional:      General: She is not in acute distress.    Appearance: Normal appearance. She is well-developed.  HENT:     Head: Normocephalic and atraumatic.  Neck:     Musculoskeletal: Normal range of motion and neck supple.  Cardiovascular:     Rate and Rhythm: Normal rate and regular rhythm.     Pulses: Normal pulses.     Heart sounds: Normal heart sounds.  Pulmonary:     Effort: Pulmonary effort is normal. No respiratory distress.  Abdominal:     General: Abdomen is flat. There is no distension.     Palpations: Abdomen is soft.     Tenderness: There is no abdominal tenderness. There is left CVA tenderness. There is no guarding.  Musculoskeletal: Normal range of motion.  Skin:    General: Skin is warm and dry.     Findings: No rash.  Neurological:     Mental Status: She is alert and oriented to person, place, and time.  Psychiatric:        Behavior: Behavior normal.        Thought Content: Thought content normal.     BP 128/78 (BP Location: Right Arm, Patient Position: Sitting, Cuff Size: Normal)   Pulse 95   Temp 97.9 F (36.6 C) (Oral)   Ht 5' 6.5" (1.689 m)   Wt 196 lb 3.2 oz (89 kg)   BMI 31.19 kg/m   Assessment and Plan: 1. Gross hematuria With persistent  microscopic hematuria Increase fluid intake - POCT Urinalysis Dipstick - Ambulatory referral to Urology  2. Colon cancer screening - Cologuard   Partially dictated using Animal nutritionist. Any errors are unintentional.  Bari Edward, MD [facility]  11/13/2018

## 2018-11-25 DIAGNOSIS — Z1211 Encounter for screening for malignant neoplasm of colon: Secondary | ICD-10-CM | POA: Diagnosis not present

## 2018-11-25 LAB — COLOGUARD: Cologuard: NEGATIVE

## 2018-11-26 DIAGNOSIS — L821 Other seborrheic keratosis: Secondary | ICD-10-CM | POA: Diagnosis not present

## 2018-12-06 ENCOUNTER — Telehealth: Payer: Self-pay | Admitting: Internal Medicine

## 2018-12-06 NOTE — Telephone Encounter (Signed)
Please let patient know that cologuard was negative.

## 2018-12-09 NOTE — Telephone Encounter (Signed)
Patient informed. 

## 2018-12-11 ENCOUNTER — Other Ambulatory Visit: Payer: Self-pay

## 2018-12-11 DIAGNOSIS — R3129 Other microscopic hematuria: Secondary | ICD-10-CM

## 2018-12-13 ENCOUNTER — Ambulatory Visit: Payer: 59 | Admitting: Urology

## 2018-12-20 ENCOUNTER — Ambulatory Visit: Payer: 59 | Admitting: Urology

## 2019-02-18 DIAGNOSIS — G8929 Other chronic pain: Secondary | ICD-10-CM | POA: Diagnosis not present

## 2019-02-18 DIAGNOSIS — M25561 Pain in right knee: Secondary | ICD-10-CM | POA: Diagnosis not present

## 2019-02-18 DIAGNOSIS — M2391 Unspecified internal derangement of right knee: Secondary | ICD-10-CM | POA: Diagnosis not present

## 2019-02-18 DIAGNOSIS — M2351 Chronic instability of knee, right knee: Secondary | ICD-10-CM | POA: Diagnosis not present

## 2019-03-05 ENCOUNTER — Telehealth: Payer: Self-pay | Admitting: Internal Medicine

## 2019-03-05 ENCOUNTER — Other Ambulatory Visit: Payer: Self-pay

## 2019-03-05 DIAGNOSIS — R310 Gross hematuria: Secondary | ICD-10-CM

## 2019-03-05 NOTE — Telephone Encounter (Signed)
Spoke with patient. She is still complaining of flank pain. Wants to be seen sooner than July which is when [facility] is scheduled.  Placed new referral for [facility] as patient requested.

## 2019-03-05 NOTE — Telephone Encounter (Signed)
Patient is requesting new referral with provider name Buroski at [facility] [place] [facility] called to cancel on her several times about her appointment and needs to get in somewhere else sooner. She complains of blood in urine and pain in her side and feels that she can no longer wait out til the end of July.

## 2019-03-09 IMAGING — US US RENAL
1 series · 14 of 25 positions shown · non-contrast
Comparison: Report of an abdominal and pelvic CT scan dated May 11, 1999

CLINICAL DATA: Hematuria, left flank and left lower quadrant pain
for several weeks

EXAM:
RENAL / URINARY TRACT ULTRASOUND COMPLETE

[Series 1: us renal · 0.25mm/px · 14 of 42 slices shown]
[im 1/42]
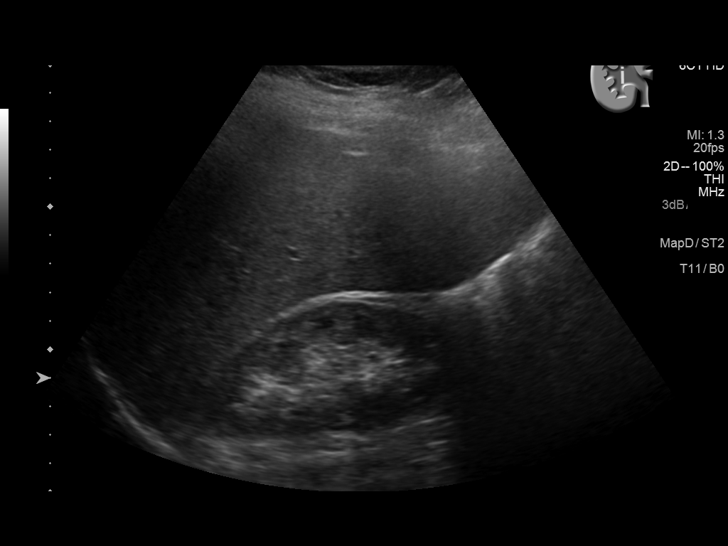
[im 4/42]
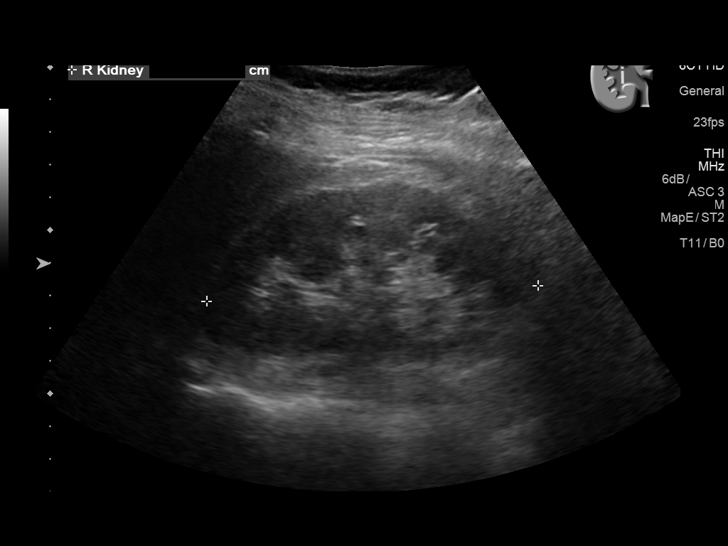
[im 7/42]
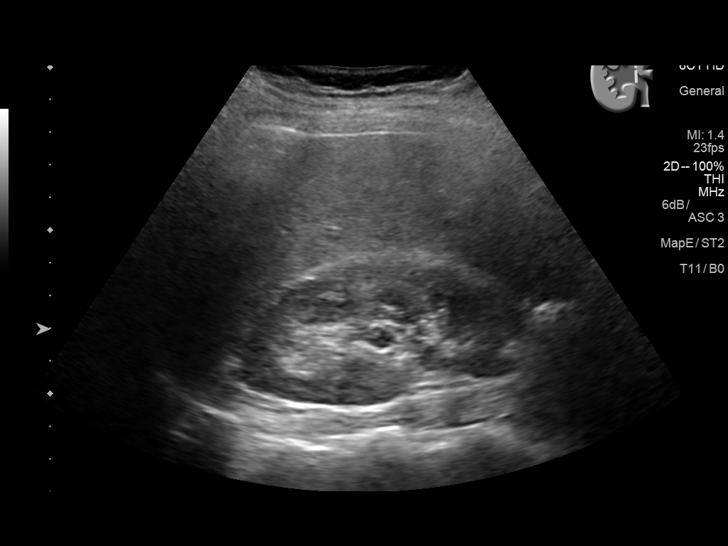
[im 11/42]
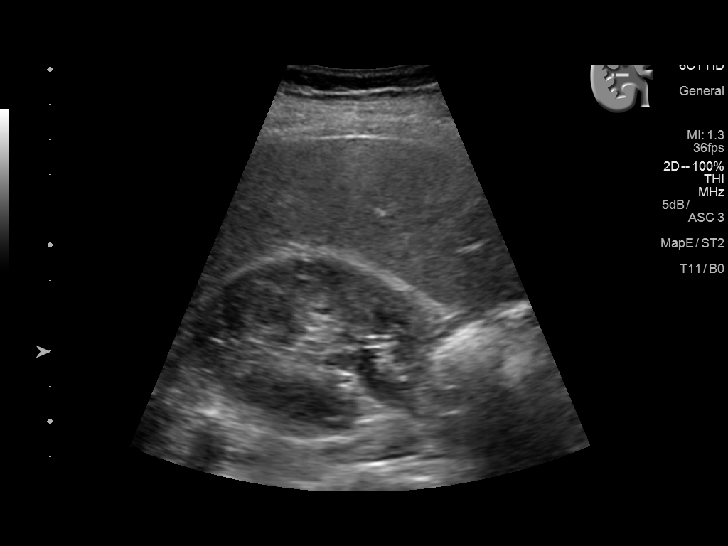
[im 14/42]
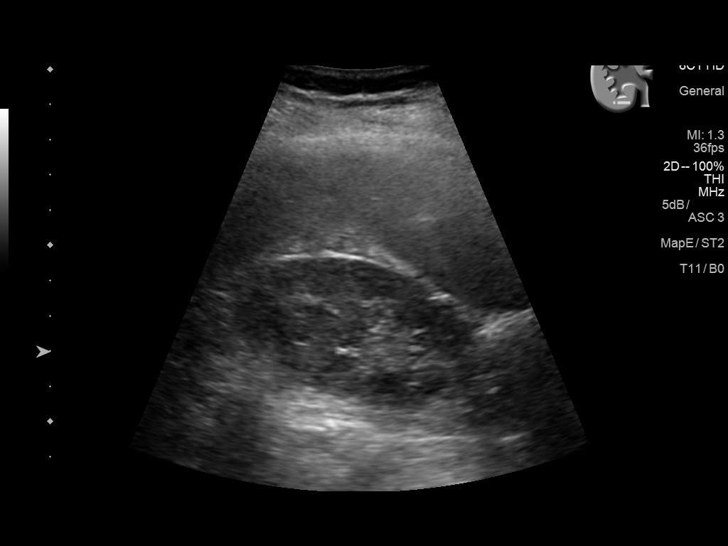
[im 16/42]
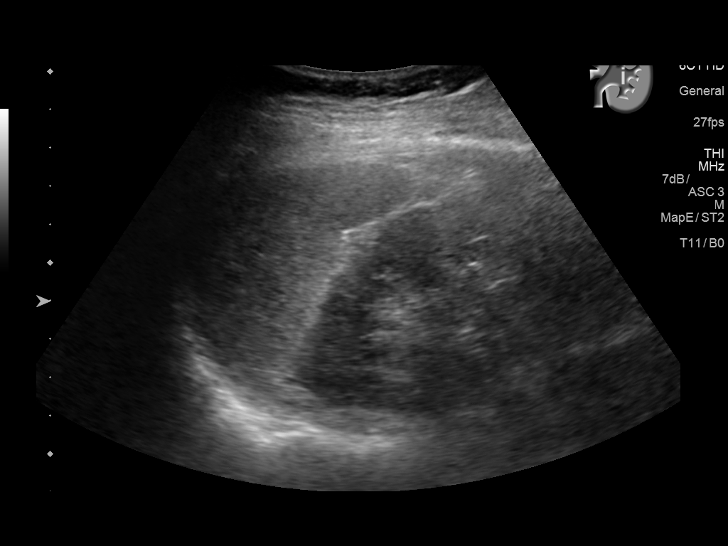
[im 19/42]
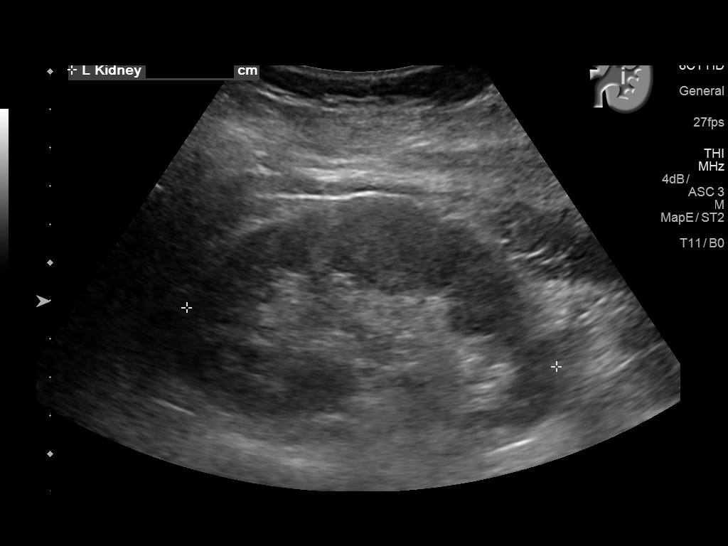
[im 23/42]
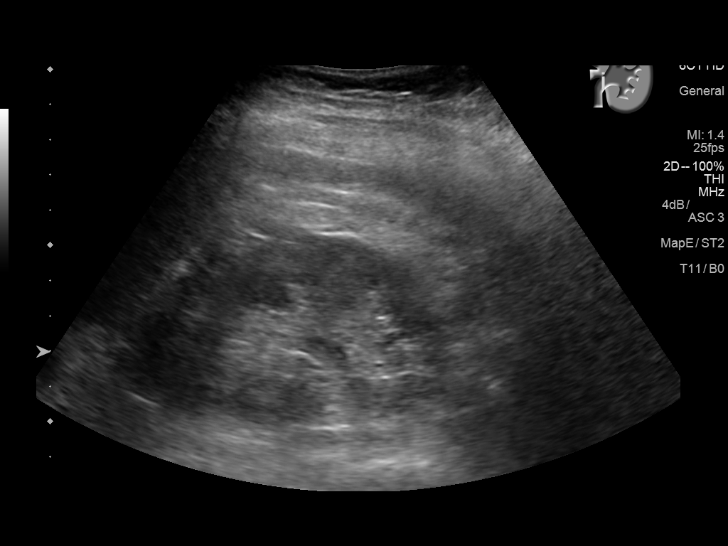
[im 26/42]
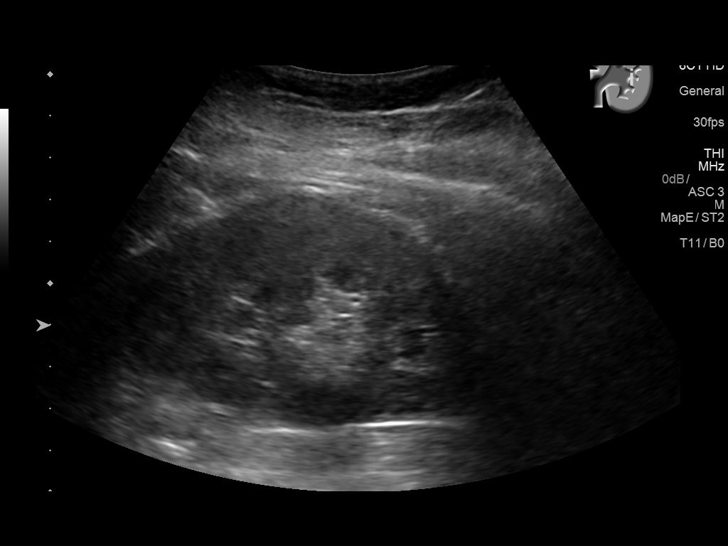
[im 28/42]
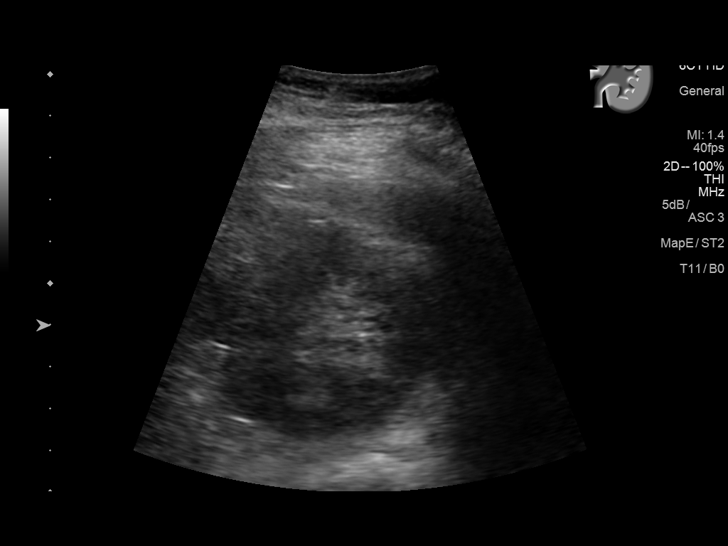
[im 31/42]
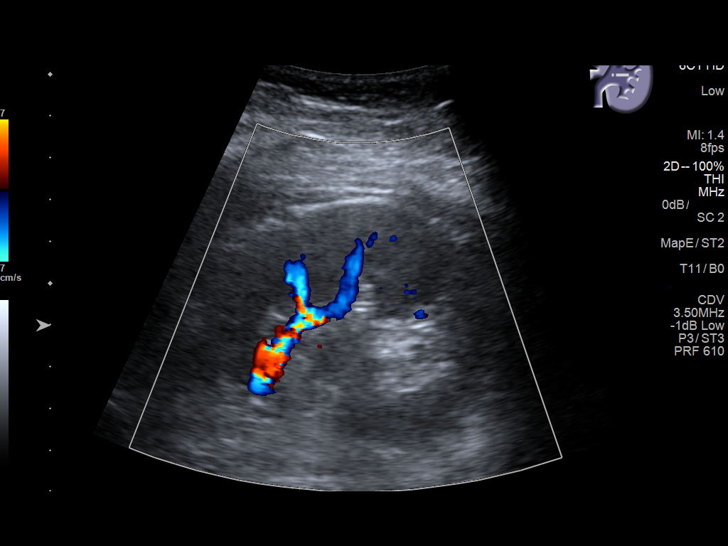
[im 35/42]
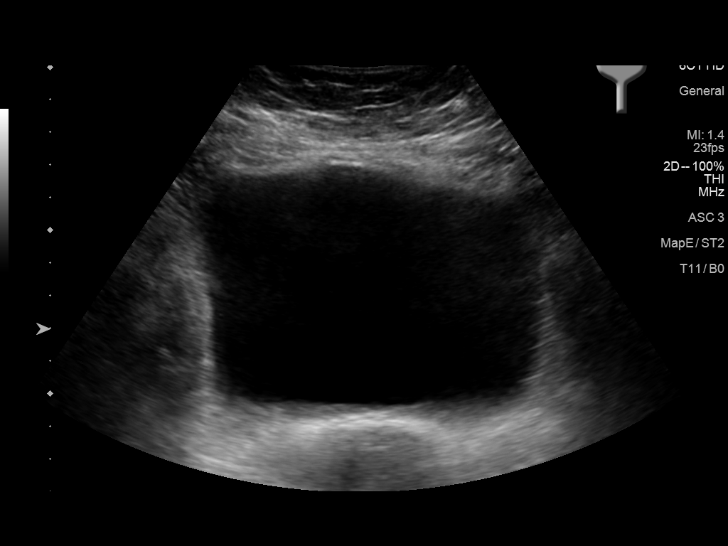
[im 38/42]
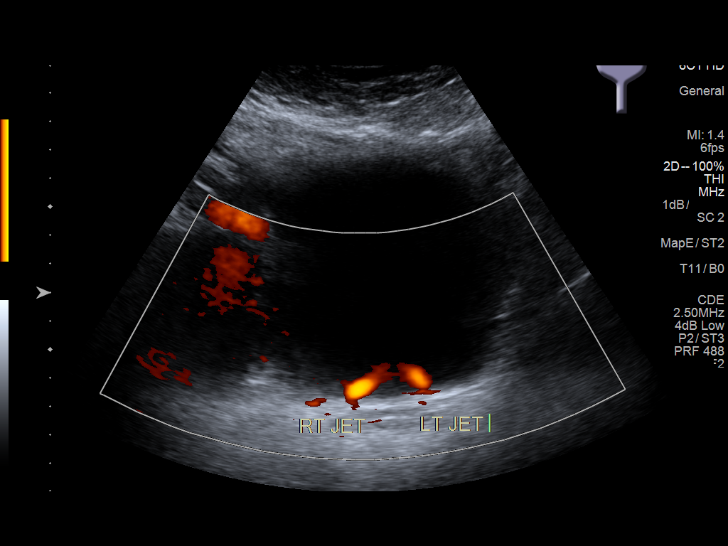
[im 42/42]
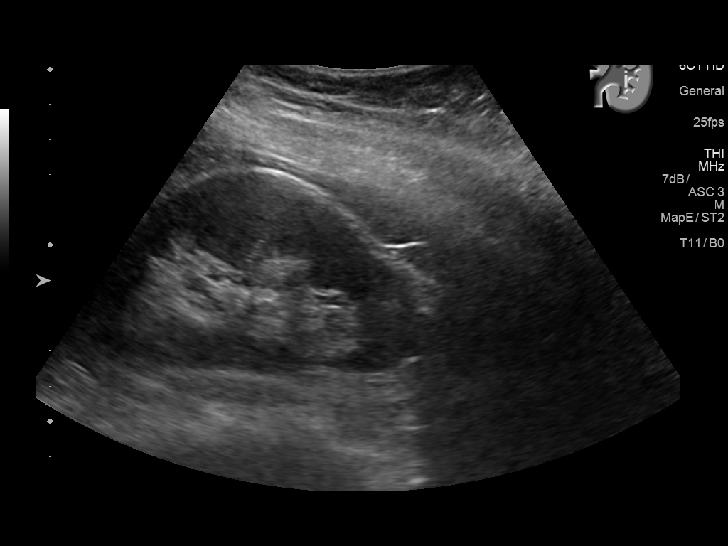

[14 of 25 positions shown; findings below may reference images not displayed]

FINDINGS: Right Kidney:

Length: 10.2 cm. Echogenicity within normal limits. No mass or
hydronephrosis visualized.

Left Kidney:

Length: 10 cm. Echogenicity within normal limits. No mass
visualized. Mild splitting of the central echo complex which
resolves on the postvoid images.

Bladder:

The partially distended urinary bladder is normal. Bilateral
ureteral jets are observed.
IMPRESSION: No obvious etiology for the patient's hematuria. Mild splitting of
the central echo complex in the left kidney may reflect mild
hydronephrosis. It was relieved on the postvoid images.

If stone disease is suspected clinically, a noncontrast
abdominopelvic CT scan would be useful. If intrinsic renal
abnormality is suspected, renal protocol MRI would be the most
useful next imaging step.

## 2019-04-11 ENCOUNTER — Ambulatory Visit: Payer: 59 | Admitting: Urology

## 2019-07-29 ENCOUNTER — Other Ambulatory Visit: Payer: Self-pay

## 2019-07-29 ENCOUNTER — Ambulatory Visit (INDEPENDENT_AMBULATORY_CARE_PROVIDER_SITE_OTHER): Payer: 59 | Admitting: Internal Medicine

## 2019-07-29 ENCOUNTER — Encounter: Payer: Self-pay | Admitting: Internal Medicine

## 2019-07-29 VITALS — BP 124/78 | HR 83 | Ht 66.5 in | Wt 190.0 lb

## 2019-07-29 DIAGNOSIS — T300 Burn of unspecified body region, unspecified degree: Secondary | ICD-10-CM | POA: Diagnosis not present

## 2019-07-29 DIAGNOSIS — L03113 Cellulitis of right upper limb: Secondary | ICD-10-CM | POA: Diagnosis not present

## 2019-07-29 DIAGNOSIS — X19XXXA Contact with other heat and hot substances, initial encounter: Secondary | ICD-10-CM

## 2019-07-29 MED ORDER — CEPHALEXIN 500 MG PO CAPS: 500.0000 mg | ORAL_CAPSULE | Freq: Four times a day (QID) | ORAL | 0 refills | Status: AC

## 2019-07-29 MED ORDER — SILVER SULFADIAZINE 1 % EX CREA
1.0000 | TOPICAL_CREAM | Freq: Two times a day (BID) | CUTANEOUS | 0 refills | Status: DC
Start: 2019-07-29 — End: 2020-08-25

## 2019-07-29 NOTE — Progress Notes (Signed)
Date:  07/29/2019   Name:  Cindy Morgan   DOB:  05/06/1961   MRN:  [id number]   Chief Complaint: Burn (Burn on arm. Last Thursday or Friday was using wax at home to remove hair off her face. Pulled wax out from the microwave and spilled it HOT on right hand. Now believes she may need abx.)  Burn The incident occurred 5 to 7 days ago. The burns occurred at home. Burn context: hot wax.  She has been treating the area with antibiotic ointment and telfa.  It is moderately painful and sligthly swollen.  She became concerned that there might be bacterial infection.  Her Tdap was done 2013 or 2014.   Review of Systems  Constitutional: Negative for chills, fatigue and fever.  Respiratory: Negative for cough, chest tightness, shortness of breath and wheezing.   Skin: Positive for wound.  Neurological: Negative for dizziness and headaches.  Psychiatric/Behavioral: Negative for sleep disturbance.    Patient Active Problem List   Diagnosis Date Noted  . Microscopic hematuria 06/20/2017  . 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 HLD (hyperlipidemia) 09/11/2015  . Failed or difficult intubation, initial encounter 03/25/2013    No Known Allergies  Past Surgical History:  Procedure Laterality Date  . NASAL SINUS SURGERY    . TONSILLECTOMY    . TUBAL LIGATION      Social History   Tobacco Use  . Smoking status: Never Smoker  . Smokeless tobacco: Never Used  Substance Use Topics  . Alcohol use: No    Alcohol/week: 0.0 standard drinks  . Drug use: No     Medication list has been reviewed and updated.  Current Meds  Medication Sig  . Multiple Vitamin (MULTI-VITAMINS) TABS Take by mouth.    PHQ 2/9 Scores 07/29/2019 06/20/2017  PHQ - 2 Score 0 0    BP Readings from Last 3 Encounters:  07/29/19 124/78  11/13/18 128/78  06/20/17 122/68    Physical Exam Vitals signs and nursing note reviewed.  Constitutional:      General: She is not in acute distress.    Appearance: She is  well-developed.  HENT:     Head: Normocephalic and atraumatic.  Pulmonary:     Effort: Pulmonary effort is normal. No respiratory distress.  Musculoskeletal: Normal range of motion.  Skin:    General: Skin is warm and dry.     Findings: Burn present. No rash.     Comments: On dorsum of of right hand and base of thumb.  Exudate and red inflamed borders. No bleeding. See photo in media.  Neurological:     Mental Status: She is alert and oriented to person, place, and time.  Psychiatric:        Behavior: Behavior normal.        Thought Content: Thought content normal.     Wt Readings from Last 3 Encounters:  07/29/19 190 lb (86.2 kg)  11/13/18 196 lb 3.2 oz (89 kg)  06/20/17 192 lb 11.2 oz (87.4 kg)      BP 124/78   Pulse 83   Ht 5' 6.5" (1.689 m)   Wt 190 lb (86.2 kg)   SpO2 99%   BMI 30.21 kg/m   Assessment and Plan: 1. Cellulitis of right upper extremity Local care advised Elevate to reduce swelling - cephALEXin (KEFLEX) 500 MG capsule; Take 1 capsule (500 mg total) by mouth 4 (four) times daily for 10 days.  Dispense: 40 capsule; Refill: 0  2. Burn  due to contact with hot substance Return if needed  - silver sulfADIAZINE (SILVADENE) 1 % cream; Apply 1 application topically 2 (two) times daily. To burn on hand  Dispense: 85 g; Refill: 0   Partially dictated using Animal nutritionist. Any errors are unintentional.  Bari Edward, MD [facility]  07/29/2019

## 2019-12-09 LAB — LIPID PANEL
Cholesterol: 304 — AB (ref 0–200)
HDL: 72 — AB (ref 35–70)
LDL Cholesterol: 202
Triglycerides: 151 (ref 40–160)

## 2019-12-09 LAB — HEMOGLOBIN A1C: Hemoglobin A1C: 5.7

## 2019-12-16 ENCOUNTER — Encounter: Payer: Self-pay | Admitting: Internal Medicine

## 2020-08-06 ENCOUNTER — Other Ambulatory Visit: Payer: Self-pay | Admitting: Obstetrics & Gynecology

## 2020-08-06 DIAGNOSIS — T884XXA Failed or difficult intubation, initial encounter: Secondary | ICD-10-CM

## 2020-08-06 NOTE — Progress Notes (Signed)
Chief Complaint:    Patient ID: Cindy Morgan is a 59 y.o. female presenting with Pre-op Exam and post menopasual bleeding  on 08/06/2020  HPI:  Postmenopausal Bleeding intermittently for ~3 years. 3-4 days at a time, thought was urinary. menopause in 70s. Thickened endometrium seen on CT urogram.  Ultrasound shows heterogenically thickened endometrium with otherwise unremarkable pelvic organs. Reccomended D&C with hysteroscopy, but she has expressed definitive desire for hysterectomy and removal of her ovaries to reduce her risk for ovarian cancer.  She has been counseled regarding her average 1% risk of ovarian cancer, the difference between major and minor surgery in both terms of surgical risk and postoperative recovery, and she has stated in no uncertain terms that she requests a hysterectomy with removal of both tubes and ovaries.     Workup:  Pap: 12/2019: neg/neg; denies history of abnormal Paps, no colposcopies or cervical biopsy in history.   EMBx: 04/2020 BENIGN ENDOMETRIAL POLYP  WITH SQUAMOUS METAPLASIA. BACKGROUND FRAGMENTED WEAKLY  PROLIFERATIVE ENDOMETRIUM WITH GLANDULAR/STROMAL BREAKDOWN.  NO HYPERPLASIA OR CARCINOMA.  TVUS: 12/2019 Uterus: retroverted 6 x 4 x 5cm  EE: 1.5cm LO: 3 x 2 x 1.5cm RO: 2 x 1.5 x 1cm No free fluid    Past Medical History:  Hyperlipidemia Past Surgical History:  has a past surgical history that includes Cesarean section and tonsilectomy. Family History: family history includes Breast cancer in her sister; Heart disease in her mother; High blood pressure (Hypertension) in her father. Social History:  reports that she has never smoked. She has never used smokeless tobacco. She reports that she does not drink alcohol and does not use drugs. OB/GYN History:  OB History    Gravida  4   Para      Term      Preterm      AB      Living  4     SAB      IAB      Ectopic      Molar      Multiple      Live Births              Allergies: has No Known Allergies. Medications:  Current Outpatient Medications:  .  cyanocobalamin (VITAMIN B12) 1,000 mcg/mL injection, Inject 1 mL (1,000 mcg total) into the muscle monthly (Patient not taking: Reported on 08/06/2020  ), Disp: 1 mL, Rfl: 5 .  ergocalciferol, vitamin D2, 1,250 mcg (50,000 unit) capsule, Take 1 capsule (50,000 Units total) by mouth once a week (Patient not taking: Reported on 08/06/2020  ), Disp: 8 capsule, Rfl: 1   Review of Systems: No SOB, no palpitations or chest pain, no new lower extremity edema, no nausea or vomiting or bowel or bladder complaints. See HPI for gyn specific ROS.   Exam:      Constitutional: BP 96/61   Ht 167.6 cm (5\' 6" )   Wt 84.5 kg (186 lb 3.2 oz)   BMI 30.05 kg/m   WDWN female in NAD   HEENT: sclera clear, non-icteric, moist mucous membranes, dentition intact Endocrine:  no thyromegaly Respiratory: normal respiratory effort, CTABL    CV: no peripheral edema, RRR no MRG GI: soft , no mass, non-tender, no rebound tenderness  GU: tanner stage 5              External genitalia/skin: vulva /labia no lesions             Lymphatic: no enlarged inguinal nodes bilaterally  Urethra: no prolapse, no diverticulum, no caruncle             Bladder: no tenderness to palpation, no cystocele             Vagina: low / platepoid pubic archnormal physiologic d/c, no lesions, normal apical support             Cervix: no lesions, no cervical motion tenderness  Cervix is high anteriorly in the vagina with no desent with valsalva; very well supported              Uterus: retroverted, deep, fundus unable to be palpated, non-tender, mobile             Adnexa: no masses bilaterally, non-tender    Skin: warm and well perfused, no rashes Neuro: alert, oriented x3,   Psych: appropriate mood and insight, judgement intact   Impression:   The primary encounter diagnosis was Preoperative exam for gynecologic surgery. Diagnoses of  Difficult airway for intubation, subsequent encounter, Post-menopausal bleeding, and Endometrial polyp were also pertinent to this visit.    Plan:   Postmenopausal Bleeding: -Discussed various options to manage thickened endometrium/endometrial polyp: D&C with hysteroscopy and option to add endometrial ablation, or progesterone IUD, vaginal / laparoscopic hysterectomy.  -She desires a hysterectomy to prevent all future bleeding and provide reassurance against future development of endometrial polyp/cancer. -Discussed surgery, risks and postoperative restrictions in detail today.  -Preoperative exam complete.  -History of difficult intubation with oral surgery @ [facility].  Request anesthesia consult.  Per exam, with little descent, history of 2 cesareans, low/flat pubic arch, and unable to palpate fundus, will recommend laparoscopic approach.  Discussed all-cause mortality association with oophorectomy prior to age 55, she demonstrated understanding and requests her ovaries also be removed.   Planned procedure: Total laparoscopic hysterectomy, bilateral salpingo-oophorectomy.   The patient and I discussed the technical aspects of the procedure including the potential for risks and complications.These include but are not limited to the risk of infection requiring post-operative antibiotics or further procedures.We talked about the risk of injury to adjacent organs including bladder, bowel, ureter, blood vessels or nerves, the need to convert to an open incision, possibleneed for blood transfusion andpostop complications such asthromboembolic or cardiopulmonary complications.All of her questions were answered. Her preoperative exam was completed. She will be referred to anesthesia and scheduled to undergo this procedure in the near future.    I personally performed the service. (TP)  Pattricia Weiher CRIST Jose Corvin, MD

## 2020-08-19 ENCOUNTER — Other Ambulatory Visit: Payer: Self-pay | Admitting: Obstetrics & Gynecology

## 2020-08-31 ENCOUNTER — Encounter
Admission: RE | Admit: 2020-08-31 | Discharge: 2020-08-31 | Disposition: A | Discharge status: Home / Self Care | Payer: 59 | Source: Ambulatory Visit | Attending: Obstetrics & Gynecology | Admitting: Obstetrics & Gynecology

## 2020-08-31 ENCOUNTER — Other Ambulatory Visit: Payer: Self-pay

## 2020-08-31 HISTORY — DX: Other complications of anesthesia, initial encounter: T88.59XA

## 2020-08-31 NOTE — Patient Instructions (Signed)
Your procedure is scheduled on: September 07, 2020 Tuesday  Report to the Registration Desk on the 1st floor of the [facility] To find out your arrival time, please call [phone] between 1PM - 3PM on: September 06, 2020 Monday   REMEMBER: Instructions that are not followed completely may result in serious medical risk, up to and including death; or upon the discretion of your surgeon and anesthesiologist your surgery may need to be rescheduled.  Do not eat food after midnight the night before surgery.  No gum chewing, lozengers or hard candies.  You may however, drink CLEAR liquids up to 2 hours before you are scheduled to arrive for your surgery. Do not drink anything within 2 hours of your scheduled arrival time.  Clear liquids include: - water  - apple juice without pulp - gatorade (not RED, PURPLE, OR BLUE) - black coffee or tea (Do NOT add milk or creamers to the coffee or tea) Do NOT drink anything that is not on this list.  Type 1 and Type 2 diabetics should only drink water.  In addition, if your doctor has ordered for you to drink the provided  Ensure Pre-Surgery Clear Carbohydrate Drink   Drinking this carbohydrate drink up to two hours before surgery helps to reduce insulin resistance and improve patient outcomes. Please complete drinking 2 hours prior to scheduled arrival time.  TAKE THESE MEDICATIONS THE MORNING OF SURGERY WITH A SIP OF WATER: none    One week prior to surgery: Stop Anti-inflammatories (NSAIDS) such as Advil, Aleve, Ibuprofen, Motrin, Naproxen, Naprosyn and aspirin or Aspirin based products such as Excedrin, Goodys Powder, BC Powder.  USE ONLY TYLENOL IF NEEDED  Stop ANY OVER THE COUNTER supplements until after surgery. (However, you may continue taking Vitamin D, Vitamin B, and multivitamin up until the day before surgery.)  No Alcohol for 24 hours before or after surgery.  No Smoking including e-cigarettes for 24 hours prior to surgery.  No  chewable tobacco products for at least 6 hours prior to surgery.  No nicotine patches on the day of surgery.  Do not use any "recreational" drugs for at least a week prior to your surgery.  Please be advised that the combination of cocaine and anesthesia may have negative outcomes, up to and including death. If you test positive for cocaine, your surgery will be cancelled.  On the morning of surgery brush your teeth with toothpaste and water, you may rinse your mouth with mouthwash if you wish. Do not swallow any toothpaste or mouthwash.  Do not wear jewelry, make-up, hairpins, clips or nail polish.  Do not wear lotions, powders, or perfumes OR DEODORANT  Do not shave body from the neck down 48 hours prior to surgery just in case you cut yourself which could leave a site for infection.  Also, freshly shaved skin may become irritated if using the CHG soap.  Contact lenses, hearing aids and dentures may not be worn into surgery.  Do not bring valuables to the hospital. [facility] is not responsible for any missing/lost belongings or valuables.   TAKE A SHOWER THE  MORNING OF SURGERY.  Notify your doctor if there is any change in your medical condition (cold, fever, infection).  Wear comfortable clothing (specific to your surgery type) to the hospital.  Plan for stool softeners for home use; pain medications have a tendency to cause constipation. You can also help prevent constipation by eating foods high in fiber such as fruits and vegetables  and drinking plenty of fluids as your diet allows.  After surgery, you can help prevent lung complications by doing breathing exercises.  Take deep breaths and cough every 1-2 hours. Your doctor may order a device called an Incentive Spirometer to help you take deep breaths. When coughing or sneezing, hold a pillow firmly against your incision with both hands. This is called "splinting." Doing this helps protect your incision. It also decreases  belly discomfort.  If you are being discharged the day of surgery, you will not be allowed to drive home. You will need a responsible adult (18 years or older) to drive you home and stay with you that night.   Please call the Pre-admissions Testing Dept. at [phone] if you have any questions about these instructions.  Visitation Policy:  Patients undergoing a surgery or procedure may have one family member or support person with them as long as that person is not COVID-19 positive or experiencing its symptoms.  That person may remain in the waiting area during the procedure.  Inpatient Visitation Update:   In an effort to ensure the safety of our team members and our patients, we are implementing a change to our visitation policy:  Effective Monday, Aug. 9, at 7 a.m., inpatients will be allowed one support person.  o The support person may change daily.  o The support person must pass our screening, gel in and out, and wear a mask at all times, including in the patient's room.  o Patients must also wear a mask when staff or their support person are in the room.  o Masking is required regardless of vaccination status.  Systemwide, no visitors 17 or younger.

## 2020-09-03 ENCOUNTER — Other Ambulatory Visit
Admission: RE | Admit: 2020-09-03 | Discharge: 2020-09-03 | Disposition: A | Discharge status: Home / Self Care | Payer: 59 | Source: Ambulatory Visit | Attending: Obstetrics & Gynecology | Admitting: Obstetrics & Gynecology

## 2020-09-03 ENCOUNTER — Other Ambulatory Visit: Payer: Self-pay

## 2020-09-03 DIAGNOSIS — Z01812 Encounter for preprocedural laboratory examination: Secondary | ICD-10-CM | POA: Diagnosis present

## 2020-09-03 DIAGNOSIS — Z20822 Contact with and (suspected) exposure to covid-19: Secondary | ICD-10-CM | POA: Insufficient documentation

## 2020-09-03 LAB — CBC
HCT: 41.7 % (ref 36.0–46.0)
Hemoglobin: 13.5 g/dL (ref 12.0–15.0)
MCH: 30.2 pg (ref 26.0–34.0)
MCHC: 32.4 g/dL (ref 30.0–36.0)
MCV: 93.3 fL (ref 80.0–100.0)
Platelets: 280 10*3/uL (ref 150–400)
RBC: 4.47 MIL/uL (ref 3.87–5.11)
RDW: 11.9 % (ref 11.5–15.5)
WBC: 5 10*3/uL (ref 4.0–10.5)
nRBC: 0 % (ref 0.0–0.2)

## 2020-09-03 LAB — BASIC METABOLIC PANEL
Anion gap: 11 (ref 5–15)
BUN: 9 mg/dL (ref 6–20)
CO2: 25 mmol/L (ref 22–32)
Calcium: 9.3 mg/dL (ref 8.9–10.3)
Chloride: 103 mmol/L (ref 98–111)
Creatinine, Ser: 0.76 mg/dL (ref 0.44–1.00)
GFR, Estimated: >60 mL/min (ref >60)
Glucose, Bld: 93 mg/dL (ref 70–99)
Potassium: 3.8 mmol/L (ref 3.5–5.1)
Sodium: 139 mmol/L (ref 135–145)

## 2020-09-03 LAB — TYPE AND SCREEN
ABO/RH(D): O POS
Antibody Screen: NEGATIVE

## 2020-09-04 LAB — SARS CORONAVIRUS 2 (TAT 6-24 HRS): SARS Coronavirus 2: NEGATIVE

## 2020-09-07 ENCOUNTER — Ambulatory Visit: Payer: 59 | Admitting: Urgent Care

## 2020-09-07 ENCOUNTER — Encounter: Payer: Self-pay | Admitting: Obstetrics & Gynecology

## 2020-09-07 ENCOUNTER — Encounter
Admission: RE | Disposition: A | Discharge status: Home / Self Care | Payer: Self-pay | Source: Home / Self Care | Attending: Obstetrics & Gynecology

## 2020-09-07 ENCOUNTER — Ambulatory Visit
Admission: RE | Admit: 2020-09-07 | Discharge: 2020-09-07 | Disposition: A | Discharge status: Home / Self Care | Payer: 59 | Attending: Obstetrics & Gynecology | Admitting: Obstetrics & Gynecology

## 2020-09-07 DIAGNOSIS — N950 Postmenopausal bleeding: Secondary | ICD-10-CM | POA: Diagnosis not present

## 2020-09-07 DIAGNOSIS — N840 Polyp of corpus uteri: Secondary | ICD-10-CM | POA: Diagnosis present

## 2020-09-07 HISTORY — PX: HYSTEROSCOPY WITH D & C: SHX1775

## 2020-09-07 HISTORY — DX: Failed or difficult intubation, initial encounter: T88.4XXA

## 2020-09-07 LAB — ABO/RH: ABO/RH(D): O POS

## 2020-09-07 SURGERY — DILATATION AND CURETTAGE /HYSTEROSCOPY
Anesthesia: General

## 2020-09-07 MED ORDER — ACETAMINOPHEN 500 MG PO TABS
1000.0000 mg | ORAL_TABLET | ORAL | Status: AC
  Administered 2020-09-07: 1000 mg via ORAL

## 2020-09-07 MED ORDER — MIDAZOLAM HCL 2 MG/2ML IJ SOLN
INTRAMUSCULAR | Status: AC
  Filled 2020-09-07: qty 2

## 2020-09-07 MED ORDER — EPHEDRINE SULFATE 50 MG/ML IJ SOLN
INTRAMUSCULAR | Status: DC | PRN
  Administered 2020-09-07: 10 mg via INTRAVENOUS
  Administered 2020-09-07: 5 mg via INTRAVENOUS
  Administered 2020-09-07 (×2): 10 mg via INTRAVENOUS

## 2020-09-07 MED ORDER — PROPOFOL 10 MG/ML IV BOLUS
INTRAVENOUS | Status: DC | PRN
  Administered 2020-09-07: 150 mg via INTRAVENOUS
  Administered 2020-09-07: 50 mg via INTRAVENOUS

## 2020-09-07 MED ORDER — GABAPENTIN 300 MG PO CAPS
ORAL_CAPSULE | ORAL | Status: AC
  Filled 2020-09-07: qty 1

## 2020-09-07 MED ORDER — ACETAMINOPHEN 500 MG PO TABS
ORAL_TABLET | ORAL | Status: AC
  Filled 2020-09-07: qty 2

## 2020-09-07 MED ORDER — FENTANYL CITRATE (PF) 100 MCG/2ML IJ SOLN: 25.0000 ug | INTRAMUSCULAR | Status: DC | PRN

## 2020-09-07 MED ORDER — KETOROLAC TROMETHAMINE 15 MG/ML IJ SOLN
INTRAMUSCULAR | Status: AC
  Filled 2020-09-07: qty 1

## 2020-09-07 MED ORDER — LACTATED RINGERS IV SOLN: INTRAVENOUS | Status: DC

## 2020-09-07 MED ORDER — KETOROLAC TROMETHAMINE 15 MG/ML IJ SOLN
15.0000 mg | INTRAMUSCULAR | Status: AC
  Administered 2020-09-07: 15 mg via INTRAVENOUS

## 2020-09-07 MED ORDER — FAMOTIDINE 20 MG PO TABS
20.0000 mg | ORAL_TABLET | Freq: Once | ORAL | Status: AC
  Administered 2020-09-07: 20 mg via ORAL

## 2020-09-07 MED ORDER — LIDOCAINE HCL (CARDIAC) PF 100 MG/5ML IV SOSY
PREFILLED_SYRINGE | INTRAVENOUS | Status: DC | PRN
  Administered 2020-09-07: 80 mg via INTRAVENOUS

## 2020-09-07 MED ORDER — KETOROLAC TROMETHAMINE 30 MG/ML IJ SOLN
INTRAMUSCULAR | Status: AC
  Filled 2020-09-07: qty 1

## 2020-09-07 MED ORDER — FENTANYL CITRATE (PF) 100 MCG/2ML IJ SOLN
INTRAMUSCULAR | Status: AC
  Filled 2020-09-07: qty 2

## 2020-09-07 MED ORDER — ONDANSETRON HCL 4 MG/2ML IJ SOLN
INTRAMUSCULAR | Status: DC | PRN
  Administered 2020-09-07: 4 mg via INTRAVENOUS

## 2020-09-07 MED ORDER — DEXAMETHASONE SODIUM PHOSPHATE 10 MG/ML IJ SOLN
INTRAMUSCULAR | Status: DC | PRN
  Administered 2020-09-07: 8 mg via INTRAVENOUS

## 2020-09-07 MED ORDER — ORAL CARE MOUTH RINSE: 15.0000 mL | Freq: Once | OROMUCOSAL | Status: AC

## 2020-09-07 MED ORDER — ONDANSETRON HCL 4 MG/2ML IJ SOLN: 4.0000 mg | Freq: Once | INTRAMUSCULAR | Status: DC | PRN

## 2020-09-07 MED ORDER — CHLORHEXIDINE GLUCONATE 0.12 % MT SOLN
15.0000 mL | Freq: Once | OROMUCOSAL | Status: AC
  Administered 2020-09-07: 15 mL via OROMUCOSAL

## 2020-09-07 MED ORDER — FENTANYL CITRATE (PF) 100 MCG/2ML IJ SOLN
INTRAMUSCULAR | Status: DC | PRN
  Administered 2020-09-07: 25 ug via INTRAVENOUS

## 2020-09-07 MED ORDER — FAMOTIDINE 20 MG PO TABS
ORAL_TABLET | ORAL | Status: AC
  Filled 2020-09-07: qty 1

## 2020-09-07 MED ORDER — PROPOFOL 10 MG/ML IV BOLUS
INTRAVENOUS | Status: AC
  Filled 2020-09-07: qty 20

## 2020-09-07 MED ORDER — GABAPENTIN 300 MG PO CAPS
300.0000 mg | ORAL_CAPSULE | ORAL | Status: AC
  Administered 2020-09-07: 300 mg via ORAL

## 2020-09-07 MED ORDER — CHLORHEXIDINE GLUCONATE 0.12 % MT SOLN
OROMUCOSAL | Status: AC
  Filled 2020-09-07: qty 15

## 2020-09-07 SURGICAL SUPPLY — 20 items
COVER WAND RF STERILE (DRAPES) ×3 IMPLANT
DEVICE MYOSURE LITE (MISCELLANEOUS) IMPLANT
DEVICE MYOSURE REACH (MISCELLANEOUS) IMPLANT
ELECT REM PT RETURN 9FT ADLT (ELECTROSURGICAL) ×3
ELECTRODE REM PT RTRN 9FT ADLT (ELECTROSURGICAL) ×1 IMPLANT
GLOVE PI ORTHOPRO 6.5 (GLOVE) ×2
GLOVE PI ORTHOPRO STRL 6.5 (GLOVE) ×1 IMPLANT
GLOVE SURG SYN 6.5 ES PF (GLOVE) ×6 IMPLANT
GOWN STRL REUS W/ TWL LRG LVL3 (GOWN DISPOSABLE) ×2 IMPLANT
GOWN STRL REUS W/TWL LRG LVL3 (GOWN DISPOSABLE) ×6
IV LACTATED RINGERS 1000ML (IV SOLUTION) ×3 IMPLANT
KIT PROCEDURE FLUENT (KITS) IMPLANT
KIT TURNOVER CYSTO (KITS) ×3 IMPLANT
MANIFOLD NEPTUNE II (INSTRUMENTS) ×3 IMPLANT
PACK DNC HYST (MISCELLANEOUS) ×3 IMPLANT
PAD OB MATERNITY 4.3X12.25 (PERSONAL CARE ITEMS) ×3 IMPLANT
PAD PREP 24X41 OB/GYN DISP (PERSONAL CARE ITEMS) ×3 IMPLANT
SEAL ROD LENS SCOPE MYOSURE (ABLATOR) ×3 IMPLANT
TUBING CONNECTING 10 (TUBING) ×2 IMPLANT
TUBING CONNECTING 10' (TUBING) ×1

## 2020-09-07 NOTE — Discharge Instructions (Signed)
You should expect to have some cramping and vaginal bleeding for about a week. This should taper off and subside, much like a period. If heavy bleeding continues or gets worse, you should contact the office for an earlier appointment.   Please call the office or physician on call for fever >101, severe pain, and heavy bleeding.   [phone]  NOTHING IN THE VAGINA FOR 2 WEEKS!!  Dr. Ward will discuss pathology results with you at your postop visit.    AMBULATORY SURGERY  DISCHARGE INSTRUCTIONS   1) The drugs that you were given will stay in your system until tomorrow so for the next 24 hours you should not:  A) Drive an automobile B) Make any legal decisions C) Drink any alcoholic beverage   2) You may resume regular meals tomorrow.  Today it is better to start with liquids and gradually work up to solid foods.  You may eat anything you prefer, but it is better to start with liquids, then soup and crackers, and gradually work up to solid foods.   3) Please notify your doctor immediately if you have any unusual bleeding, trouble breathing, redness and pain at the surgery site, drainage, fever, or pain not relieved by medication.    4) Additional Instructions:        Please contact your physician with any problems or Same Day Surgery at [phone], Monday through Friday 6 am to 4 pm, or [place] [facility] [place] [facility] number at [phone].  

## 2020-09-07 NOTE — Anesthesia Postprocedure Evaluation (Signed)
Anesthesia Post Note  Patient: Cindy Morgan  Procedure(s) Performed: DILATATION AND CURETTAGE /HYSTEROSCOPY (N/A )  Patient location during evaluation: PACU Anesthesia Type: General Level of consciousness: awake and alert Pain management: pain level controlled Vital Signs Assessment: post-procedure vital signs reviewed and stable Respiratory status: spontaneous breathing, nonlabored ventilation, respiratory function stable and patient connected to nasal cannula oxygen Cardiovascular status: blood pressure returned to baseline and stable Postop Assessment: no apparent nausea or vomiting Anesthetic complications: no   No complications documented.   Last Vitals:  Vitals:   09/07/20 1246 09/07/20 1259  BP: 129/70 133/74  Pulse: 89 81  Resp: 17 16  Temp: (!) 36.3 C 36.6 C  SpO2: 98% 100%    Last Pain:  Vitals:   09/07/20 1259  TempSrc: Temporal  PainSc: 0-No pain                 Corinda Gubler

## 2020-09-07 NOTE — Anesthesia Procedure Notes (Signed)
Procedure Name: LMA Insertion Date/Time: 09/07/2020 11:24 AM Performed by: Henrietta Hoover, CRNA Pre-anesthesia Checklist: Patient identified, Patient being monitored, Timeout performed, Emergency Drugs available and Suction available Patient Re-evaluated:Patient Re-evaluated prior to induction Oxygen Delivery Method: Circle system utilized Preoxygenation: Pre-oxygenation with 100% oxygen Induction Type: IV induction Ventilation: Mask ventilation without difficulty LMA: LMA inserted LMA Size: 4.0 Tube type: Oral Number of attempts: 1 Placement Confirmation: positive ETCO2 and breath sounds checked- equal and bilateral Tube secured with: Tape Dental Injury: Teeth and Oropharynx as per pre-operative assessment

## 2020-09-07 NOTE — Transfer of Care (Signed)
Immediate Anesthesia Transfer of Care Note  Patient: Cindy Morgan  Procedure(s) Performed: DILATATION AND CURETTAGE /HYSTEROSCOPY (N/A )  Patient Location: PACU  Anesthesia Type:General  Level of Consciousness: drowsy  Airway & Oxygen Therapy: Patient Spontanous Breathing  Post-op Assessment: Report given to RN and Post -op Vital signs reviewed and stable  Post vital signs: Reviewed and stable  Last Vitals:  Vitals Value Taken Time  BP 111/62 09/07/20 1216  Temp    Pulse 92 09/07/20 1217  Resp 19 09/07/20 1217  SpO2 100 % 09/07/20 1217  Vitals shown include unvalidated device data.  Last Pain:  Vitals:   09/07/20 0827  TempSrc: Oral  PainSc: 0-No pain         Complications: No complications documented.

## 2020-09-07 NOTE — Op Note (Signed)
Operative Report Hysteroscopy, Dilation and Curettage 09/07/2020  Patient:  Cindy Morgan  59 y.o. female Preoperative diagnosis:  post menopausal bleeding, endometrial polyp Postoperative diagnosis:  post menopausal bleeding, endometrial polyp  PROCEDURE:  Procedure(s): DILATATION AND CURETTAGE /HYSTEROSCOPY (N/A) Surgeon:  Surgeon(s) and Role:    * Pama Roskos, Elenora Fender, MD - Primary Anesthesia:  LMA I/O: Total I/O In: 900 [I.V.:900] Out: 5 [Blood:5] Specimens:  Endometrial curettings, endometrial polyps Complications: None Apparent Disposition:  VS stable to PACU  Findings: Uterus, mobile, normal size, sounding to 10 cm; normal cervix, vagina, perineum.  Hysteroscopy:  Atrophic endometrium.  Two polyps, both attached to left anteriolateral wall.  Removed entirely. Bilateral tubal ostea patent.  Indication for procedure/Consents: 59 y.o. G4P4  here for scheduled surgery for the aforementioned diagnoses.  Risks of surgery were discussed with the patient including but not limited to: bleeding which may require transfusion; infection which may require antibiotics; injury to uterus or surrounding organs; intrauterine scarring; need for additional procedures including laparotomy or laparoscopy; and other postoperative/anesthesia complications. Written informed consent was obtained.    Procedure Details:   The patient was then taken to the operating room where anesthesia was administered and was found to be adequate.  After a formal timeout was performed, she was placed in the dorsal lithotomy position and examined with the above findings. She was then prepped and draped in the sterile manner.  A speculum was then placed in the patient's vagina and a single tooth tenaculum was applied to the anterior lip of the cervix.  The uterus was sounded to 10cm. Her cervix was serially dilated to accommodate the myoscope, with findings as above. A ringed forceps was advanced into the uterus, grasped the polyp  and with a gentle tug, the polyp was severed at its base.  A second pass produced the second polyp.  A sharp curettage was then performed until there was a gritty texture in all four quadrants. The specimens were handed off to nursing.  The camera was reinserted and confirmed the uterus had been evacuated. The tenaculum was removed from the anterior lip of the cervix, silver nitrate applied to the tenaculum site,  and the vaginal speculum was removed after noting good hemostasis. The patient tolerated the procedure well and was taken to the recovery area awake, extubated and in stable condition.  The patient will be discharged to home as per PACU criteria.  Routine postoperative instructions given. She will follow up in the clinic in two to four weeks for postoperative evaluation.  Ranae Plumber, MD [facility] Attending Gynecologist

## 2020-09-07 NOTE — Anesthesia Preprocedure Evaluation (Signed)
Anesthesia Evaluation  Patient identified by MRN, date of birth, ID band Patient awake    Reviewed: Allergy & Precautions, NPO status , Patient's Chart, lab work & pertinent test results  History of Anesthesia Complications (+) DIFFICULT AIRWAY and history of anesthetic complications  Airway Mallampati: III  TM Distance: <3 FB     Dental  (+) Teeth Intact   Pulmonary neg pulmonary ROS,    Pulmonary exam normal        Cardiovascular negative cardio ROS Normal cardiovascular exam     Neuro/Psych negative neurological ROS  negative psych ROS   GI/Hepatic negative GI ROS, Neg liver ROS,   Endo/Other  negative endocrine ROS  Renal/GU negative Renal ROS  Female GU complaint     Musculoskeletal negative musculoskeletal ROS (+)   Abdominal Normal abdominal exam  (+)   Peds negative pediatric ROS (+)  Hematology negative hematology ROS (+)   Anesthesia Other Findings Past Medical History: No date: Complication of anesthesia     Comment:  DIFFICULT INTUBATION No date: Difficult intubation  Reproductive/Obstetrics                             Anesthesia Physical Anesthesia Plan  ASA: II  Anesthesia Plan: General   Post-op Pain Management:    Induction: Intravenous  PONV Risk Score and Plan:   Airway Management Planned: LMA  Additional Equipment:   Intra-op Plan:   Post-operative Plan: Extubation in OR  Informed Consent: I have reviewed the patients History and Physical, chart, labs and discussed the procedure including the risks, benefits and alternatives for the proposed anesthesia with the patient or authorized representative who has indicated his/her understanding and acceptance.     Dental advisory given  Plan Discussed with: CRNA and Surgeon  Anesthesia Plan Comments: (Apparently difficult nasal intubation, but easy mask ventilation.  )        Anesthesia Quick  Evaluation

## 2020-09-07 NOTE — H&P (Signed)
Day-of-Surgery Preoperative History and Physical   Cindy Morgan is a 59 y.o. G4P4 here for surgical management of postmenopausal bleeding.   Preoperative concerns have been addressed.  Proposed surgery: Dilation and Curettage, Hysteroscopy  Past Medical History:  Diagnosis Date  . Complication of anesthesia    DIFFICULT INTUBATION  . Difficult intubation    Past Surgical History:  Procedure Laterality Date  . CESAREAN SECTION  1992, 2003  . NASAL SINUS SURGERY  2016   REMOVAL OF MASS ON GUM NEAR SINUS CAVITY ON RIGHT  . TONSILLECTOMY    . TUBAL LIGATION     OB History  Gravida Para Term Preterm AB Living  4 4          SAB TAB Ectopic Multiple Live Births               # Outcome Date GA Lbr Len/2nd Weight Sex Delivery Anes PTL Lv  4 Para 2003    M CS-Unspec     3 Para 1994    M Vag-Spont     2 Para 1992    F CS-Unspec     1 Para 1990    F Vag-Spont     Patient denies any other pertinent gynecologic issues.   No current facility-administered medications on file prior to encounter.   Current Outpatient Medications on File Prior to Encounter  Medication Sig Dispense Refill  . Multiple Vitamin (MULTIVITAMIN WITH MINERALS) TABS tablet Take 1 tablet by mouth 3 (three) times a week.    . cyanocobalamin (,VITAMIN B-12,) 1000 MCG/ML injection Inject 1,000 mcg into the muscle every 30 (thirty) days.     No Known Allergies  Social History:   reports that she has never smoked. She has never used smokeless tobacco. She reports that she does not drink alcohol and does not use drugs.  Family History  Problem Relation Age of Onset  . Breast cancer Sister     Review of Systems: Noncontributory  PHYSICAL EXAM: Blood pressure (!) 151/72, pulse 78, temperature 97.8 F (36.6 C), temperature source Oral, resp. rate 14, height 5\' 6"  (1.676 m), weight 84.4 kg, SpO2 100 %. General appearance - alert, well appearing, and in no distress Chest - clear to auscultation, symmetric air entry,  normal respiratory effort Heart - normal rate and regular rhythm Abdomen - soft, nontender, non-distended Pelvic - examination not performed in preop area Extremities - peripheral pulses normal, no pedal edema, no clubbing or cyanosis  Labs: Results for orders placed or performed during the hospital encounter of 09/07/20 (from the past 336 hour(s))  ABO/Rh   Collection Time: 09/07/20  8:22 AM  Result Value Ref Range   ABO/RH(D)      O POS Performed at [facility], [address] [place], [place]   Results for orders placed or performed during the hospital encounter of 09/03/20 (from the past 336 hour(s))  SARS CORONAVIRUS 2 (TAT 6-24 HRS) Nasopharyngeal Nasopharyngeal Swab   Collection Time: 09/03/20  9:13 AM   Specimen: Nasopharyngeal Swab  Result Value Ref Range   SARS Coronavirus 2 NEGATIVE NEGATIVE  Basic metabolic panel   Collection Time: 09/03/20  9:13 AM  Result Value Ref Range   Sodium 139 135 - 145 mmol/L   Potassium 3.8 3.5 - 5.1 mmol/L   Chloride 103 98 - 111 mmol/L   CO2 25 22 - 32 mmol/L   Glucose, Bld 93 70 - 99 mg/dL   BUN 9 6 - 20 mg/dL  Creatinine, Ser 0.76 0.44 - 1.00 mg/dL   Calcium 9.3 8.9 - 22.2 mg/dL   GFR, Estimated >97 >98 mL/min   Anion gap 11 5 - 15  CBC   Collection Time: 09/03/20  9:13 AM  Result Value Ref Range   WBC 5.0 4.0 - 10.5 K/uL   RBC 4.47 3.87 - 5.11 MIL/uL   Hemoglobin 13.5 12.0 - 15.0 g/dL   HCT 92.1 36 - 46 %   MCV 93.3 80.0 - 100.0 fL   MCH 30.2 26.0 - 34.0 pg   MCHC 32.4 30.0 - 36.0 g/dL   RDW 19.4 17.4 - 08.1 %   Platelets 280 150 - 400 K/uL   nRBC 0.0 0.0 - 0.2 %  Type and screen [facility]   Collection Time: 09/03/20  9:13 AM  Result Value Ref Range   ABO/RH(D) O POS    Antibody Screen NEG    Sample Expiration 09/17/2020,2359    Extend sample reason      NO TRANSFUSIONS OR PREGNANCY IN THE PAST 3 MONTHS Performed at [facility], [address], [place],  [place] [address]     Imaging Studies: No results found.  Assessment: Patient Active Problem List   Diagnosis Date Noted  . Microscopic hematuria 06/20/2017  . 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 HLD (hyperlipidemia) 09/11/2015  . Failed or difficult intubation, initial encounter 03/25/2013    Plan: Patient will undergo surgical management with Dilation and Curettage, Hysteroscopy.   The risks of surgery were discussed in detail with the patient including but not limited to: bleeding which may require transfusion or reoperation; infection which may require antibiotics; injury to surrounding organs which may involve bowel, bladder, ureters ; need for additional procedures including laparoscopy or laparotomy; thromboembolic phenomenon, surgical site problems and other postoperative/anesthesia complications. Likelihood of success in alleviating the patient's condition was discussed. Routine postoperative instructions will be reviewed with the patient and her family in detail after surgery.  The patient concurred with the proposed plan, giving informed written consent for the surgery.  Patient has been NPO since last night she will remain NPO for procedure.  Anesthesia and OR aware. To OR when ready.  ----- Ranae Plumber, MD, FACOG Attending Obstetrician and Gynecologist [facility], Department of OB/GYN [facility]  09/07/2020 10:30 AM

## 2020-09-09 LAB — SURGICAL PATHOLOGY

## 2022-01-06 ENCOUNTER — Telehealth: Payer: 59

## 2024-10-20 ENCOUNTER — Ambulatory Visit: Admission: RE | Admit: 2024-10-20 | Source: Home / Self Care | Admitting: Gastroenterology

## 2024-10-20 ENCOUNTER — Encounter: Admission: RE | Payer: Self-pay | Source: Home / Self Care

## 2024-10-20 SURGERY — COLONOSCOPY
Anesthesia: General
